# Patient Record
Sex: Male | Born: 1950 | Race: White | Hispanic: No | Marital: Single | Smoking: Never smoker
Health system: Southern US, Community
[De-identification: ages and names within clinical notes are randomized; demographics above are authoritative.]

## PROBLEM LIST (undated history)

## (undated) DIAGNOSIS — E669 Obesity, unspecified: Secondary | ICD-10-CM

## (undated) DIAGNOSIS — Z7901 Long term (current) use of anticoagulants: Secondary | ICD-10-CM

## (undated) DIAGNOSIS — I10 Essential (primary) hypertension: Secondary | ICD-10-CM

## (undated) DIAGNOSIS — E785 Hyperlipidemia, unspecified: Secondary | ICD-10-CM

## (undated) DIAGNOSIS — I739 Peripheral vascular disease, unspecified: Secondary | ICD-10-CM

## (undated) DIAGNOSIS — D696 Thrombocytopenia, unspecified: Secondary | ICD-10-CM

## (undated) DIAGNOSIS — E1140 Type 2 diabetes mellitus with diabetic neuropathy, unspecified: Secondary | ICD-10-CM

## (undated) DIAGNOSIS — E119 Type 2 diabetes mellitus without complications: Secondary | ICD-10-CM

## (undated) DIAGNOSIS — I2510 Atherosclerotic heart disease of native coronary artery without angina pectoris: Secondary | ICD-10-CM

## (undated) DIAGNOSIS — Z9114 Patient's other noncompliance with medication regimen: Secondary | ICD-10-CM

## (undated) DIAGNOSIS — F22 Delusional disorders: Secondary | ICD-10-CM

## (undated) DIAGNOSIS — Z91148 Patient's other noncompliance with medication regimen for other reason: Secondary | ICD-10-CM

## (undated) DIAGNOSIS — I4891 Unspecified atrial fibrillation: Secondary | ICD-10-CM

## (undated) HISTORY — DX: Obesity, unspecified: E66.9

## (undated) HISTORY — PX: TONSILECTOMY, ADENOIDECTOMY, BILATERAL MYRINGOTOMY AND TUBES: SHX2538

## (undated) HISTORY — DX: Peripheral vascular disease, unspecified: I73.9

## (undated) HISTORY — DX: Type 2 diabetes mellitus with diabetic neuropathy, unspecified: E11.40

## (undated) HISTORY — DX: Delusional disorders: F22

## (undated) HISTORY — DX: Unspecified atrial fibrillation: I48.91

## (undated) HISTORY — DX: Essential (primary) hypertension: I10

## (undated) HISTORY — DX: Atherosclerotic heart disease of native coronary artery without angina pectoris: I25.10

## (undated) HISTORY — PX: APPENDECTOMY: SHX54

## (undated) HISTORY — DX: Hyperlipidemia, unspecified: E78.5

---

## 1998-01-16 HISTORY — PX: LEFT HEART CATH: SHX5946

## 2004-03-01 ENCOUNTER — Encounter: Admission: RE | Admit: 2004-03-01 | Discharge: 2004-03-01 | Payer: Self-pay

## 2012-04-16 ENCOUNTER — Encounter: Payer: Self-pay | Admitting: Vascular Surgery

## 2012-04-16 ENCOUNTER — Other Ambulatory Visit: Payer: Self-pay | Admitting: *Deleted

## 2012-04-16 DIAGNOSIS — L97909 Non-pressure chronic ulcer of unspecified part of unspecified lower leg with unspecified severity: Secondary | ICD-10-CM

## 2012-04-16 DIAGNOSIS — I739 Peripheral vascular disease, unspecified: Secondary | ICD-10-CM

## 2012-05-15 ENCOUNTER — Encounter: Payer: Self-pay | Admitting: Vascular Surgery

## 2012-05-16 ENCOUNTER — Encounter: Payer: Self-pay | Admitting: Vascular Surgery

## 2012-06-20 ENCOUNTER — Other Ambulatory Visit: Payer: Self-pay | Admitting: *Deleted

## 2012-06-20 DIAGNOSIS — L97909 Non-pressure chronic ulcer of unspecified part of unspecified lower leg with unspecified severity: Secondary | ICD-10-CM

## 2012-07-16 HISTORY — PX: CARDIOVASCULAR STRESS TEST: SHX262

## 2012-07-31 ENCOUNTER — Encounter: Payer: Self-pay | Admitting: Vascular Surgery

## 2012-08-01 ENCOUNTER — Encounter: Payer: Self-pay | Admitting: Vascular Surgery

## 2012-08-06 ENCOUNTER — Encounter: Payer: Self-pay | Admitting: Vascular Surgery

## 2012-08-07 ENCOUNTER — Encounter: Payer: Self-pay | Admitting: Vascular Surgery

## 2012-08-07 ENCOUNTER — Ambulatory Visit (INDEPENDENT_AMBULATORY_CARE_PROVIDER_SITE_OTHER): Payer: Medicare Other | Admitting: Vascular Surgery

## 2012-08-07 ENCOUNTER — Encounter (INDEPENDENT_AMBULATORY_CARE_PROVIDER_SITE_OTHER): Payer: Medicare Other | Admitting: *Deleted

## 2012-08-07 VITALS — BP 123/86 | HR 94 | Temp 97.0°F | Resp 16 | Ht 70.5 in | Wt 234.0 lb

## 2012-08-07 DIAGNOSIS — L97909 Non-pressure chronic ulcer of unspecified part of unspecified lower leg with unspecified severity: Secondary | ICD-10-CM

## 2012-08-07 DIAGNOSIS — I739 Peripheral vascular disease, unspecified: Secondary | ICD-10-CM

## 2012-08-07 NOTE — Progress Notes (Signed)
Vascular and Vein Specialist of [facility]  Patient name: Samuel Mccoy MRN: [id number] DOB: 29-Aug-1950 Sex: male  REASON FOR CONSULT: Nonhealing wound of the right great toe. Third by Dr. Brent Bulla.  HPI: Samuel Mccoy is a 62 y.o. male who has a long history of ulcers on both feet. No significant ulcer at this point is a wound over the metatarsal head of the right great toe on the plantar surface of the foot. He is followed by Dr. Brent Bulla. Is not sure exactly when the ulcer began. He does state that she's had chronic problems with wounds on his feet. He denies any history of claudication or rest pain. He is unaware of any previous history of DVT or phlebitis. Does have diabetes but states his blood sugar has been relatively well controlled. I have reviewed his records from Dr. Lamar Sprinkles office. He has had a chronic wound of the lower legs and has been having aggressive outpatient care for these. Given that the right toe wound was not making significant progress he was sent for vascular consultation.  Past Medical History  Diagnosis Date  . Ulcer April 2014    Bilateral foot  . Diabetes mellitus without complication     Type II  . Diabetic neuropathy     Bilateral foot  . Peripheral vascular disease   . Coronary atherosclerosis of unspecified type of vessel, native or graft   . Hyperlipidemia   . Obese   . Atrial fibrillation   . Hypertension   . Paranoid type delusional disorder    Family History  Problem Relation Age of Onset  . Diabetes Father   . Heart disease Father     Heart Disease before age 59  . Cancer Father   . Hyperlipidemia Father   . Hypertension Father   . Heart attack Father   . Hypertension Mother   . Cancer Mother   . Deep vein thrombosis Mother    SOCIAL HISTORY: History  Substance Use Topics  . Smoking status: Never Smoker   . Smokeless tobacco: Never Used  . Alcohol Use: No   Not on File  Current Outpatient Prescriptions   Medication Sig Dispense Refill  . aspirin 81 MG tablet Take 81 mg by mouth daily.      . carvedilol (COREG) 25 MG tablet Take 25 mg by mouth 2 (two) times daily with a meal.      . glyBURIDE-metformin (GLUCOVANCE) 5-500 MG per tablet Take 1 tablet by mouth daily with breakfast.      . lisinopril (PRINIVIL,ZESTRIL) 20 MG tablet Take 20 mg by mouth daily.      . pravastatin (PRAVACHOL) 40 MG tablet Take 40 mg by mouth daily.      . Rivaroxaban (XARELTO) 20 MG TABS Take by mouth daily.      . cloNIDine (CATAPRES) 0.2 MG tablet Take 0.2 mg by mouth 2 (two) times daily.       No current facility-administered medications for this visit.   REVIEW OF SYSTEMS: [name] ] denotes positive finding; [  ] denotes negative finding  CARDIOVASCULAR:  [ ]  chest pain   [ ]  chest pressure   [ ]  palpitations   [ ]  orthopnea   [ ]  dyspnea on exertion   [ ]  claudication   [ ]  rest pain   [ ]  DVT   [ ]  phlebitis PULMONARY:   [ ]  productive cough   [ ]  asthma   [ ]  wheezing NEUROLOGIC:   [ ]   weakness  [ ]  paresthesias  [ ]  aphasia  [ ]  amaurosis  [name] ] dizziness HEMATOLOGIC:   [ ]  bleeding problems   [ ]  clotting disorders MUSCULOSKELETAL:  [ ]  joint pain   [ ]  joint swelling [ ]  leg swelling GASTROINTESTINAL: [ ]   blood in stool  [ ]   hematemesis GENITOURINARY:  [ ]   dysuria  [ ]   hematuria PSYCHIATRIC:  [ ]  history of major depression INTEGUMENTARY:  [ ]  rashes  [name] ] ulcers CONSTITUTIONAL:  [ ]  fever   [ ]  chills  PHYSICAL EXAM: Filed Vitals:   08/07/12 1439  BP: 123/86  Pulse: 94  Temp: 97 F (36.1 C)  TempSrc: Oral  Resp: 16  Height: 5' 10.5" (1.791 m)  Weight: 234 lb (106.142 kg)  SpO2: 97%   Body mass index is 33.09 kg/(m^2). GENERAL: The patient is a well-nourished male, in no acute distress. The vital signs are documented above. CARDIOVASCULAR: There is a regular rate and rhythm. Do not detect carotid bruits. He has palpable femoral, popliteal, dorsalis pedis, and posterior tibial pulses  bilaterally. He has no significant lower extremity swelling. PULMONARY: There is good air exchange bilaterally without wheezing or rales. ABDOMEN: Soft and non-tender with normal pitched bowel sounds.  MUSCULOSKELETAL: There are no major deformities or cyanosis. NEUROLOGIC: No focal weakness or paresthesias are detected. SKIN: There are no ulcers or rashes noted. He has hyperpigmentation bilaterally consistent with chronic venous insufficiency. I did probe the wound on the lateral aspect of his right metatarsal head and did not note any exposed bone. PSYCHIATRIC: The patient has a normal affect.  DATA:  I have independently interpreted his arterial Doppler study which shows triphasic Doppler signals in the dorsalis pedis and posterior tibial positions bilaterally with ABIs of 100% bilaterally.  MEDICAL ISSUES: Based on his exam and arterial Doppler study he has no evidence of significant peripheral vascular disease and should have adequate circulation to heal the wound on his toe. It looks like he does need some debridement of the callus and he will follow up with his primary care physician on this. If this wound failed to heal or progressed and he would require primary amputation of the toe. With respect to his venous insufficiency, he does not have any venous ulcers currently. However we have discussed the importance of intermittent leg elevation. I be happy to see him back at any time if any new vascular issues arise.  Alyssamarie Mounsey S [facility] [place] [facility]: [phone]

## 2013-07-01 ENCOUNTER — Ambulatory Visit (HOSPITAL_COMMUNITY): Admit: 2013-07-01 | Payer: Self-pay | Admitting: Cardiology

## 2013-07-01 ENCOUNTER — Observation Stay (HOSPITAL_COMMUNITY)
Admission: EM | Admit: 2013-07-01 | Discharge: 2013-07-03 | Disposition: A | Discharge status: Home / Self Care | Payer: Medicare Other | Attending: Emergency Medicine | Admitting: Emergency Medicine

## 2013-07-01 ENCOUNTER — Encounter (HOSPITAL_COMMUNITY)
Admission: EM | Disposition: A | Discharge status: Home / Self Care | Payer: Self-pay | Source: Home / Self Care | Attending: Emergency Medicine

## 2013-07-01 ENCOUNTER — Encounter (HOSPITAL_COMMUNITY): Payer: Self-pay | Admitting: Emergency Medicine

## 2013-07-01 ENCOUNTER — Emergency Department (HOSPITAL_COMMUNITY): Payer: Medicare Other

## 2013-07-01 DIAGNOSIS — E669 Obesity, unspecified: Secondary | ICD-10-CM | POA: Insufficient documentation

## 2013-07-01 DIAGNOSIS — Z872 Personal history of diseases of the skin and subcutaneous tissue: Secondary | ICD-10-CM | POA: Insufficient documentation

## 2013-07-01 DIAGNOSIS — Z7982 Long term (current) use of aspirin: Secondary | ICD-10-CM | POA: Insufficient documentation

## 2013-07-01 DIAGNOSIS — Z79899 Other long term (current) drug therapy: Secondary | ICD-10-CM | POA: Insufficient documentation

## 2013-07-01 DIAGNOSIS — D696 Thrombocytopenia, unspecified: Secondary | ICD-10-CM | POA: Diagnosis present

## 2013-07-01 DIAGNOSIS — I10 Essential (primary) hypertension: Secondary | ICD-10-CM | POA: Insufficient documentation

## 2013-07-01 DIAGNOSIS — Z8659 Personal history of other mental and behavioral disorders: Secondary | ICD-10-CM | POA: Insufficient documentation

## 2013-07-01 DIAGNOSIS — Z91148 Patient's other noncompliance with medication regimen for other reason: Secondary | ICD-10-CM

## 2013-07-01 DIAGNOSIS — Z7901 Long term (current) use of anticoagulants: Secondary | ICD-10-CM | POA: Insufficient documentation

## 2013-07-01 DIAGNOSIS — L97909 Non-pressure chronic ulcer of unspecified part of unspecified lower leg with unspecified severity: Secondary | ICD-10-CM | POA: Diagnosis present

## 2013-07-01 DIAGNOSIS — E785 Hyperlipidemia, unspecified: Secondary | ICD-10-CM

## 2013-07-01 DIAGNOSIS — I2510 Atherosclerotic heart disease of native coronary artery without angina pectoris: Secondary | ICD-10-CM | POA: Insufficient documentation

## 2013-07-01 DIAGNOSIS — E119 Type 2 diabetes mellitus without complications: Secondary | ICD-10-CM | POA: Diagnosis present

## 2013-07-01 DIAGNOSIS — I483 Typical atrial flutter: Secondary | ICD-10-CM

## 2013-07-01 DIAGNOSIS — Z9889 Other specified postprocedural states: Secondary | ICD-10-CM | POA: Insufficient documentation

## 2013-07-01 DIAGNOSIS — I739 Peripheral vascular disease, unspecified: Secondary | ICD-10-CM

## 2013-07-01 DIAGNOSIS — E1149 Type 2 diabetes mellitus with other diabetic neurological complication: Secondary | ICD-10-CM | POA: Insufficient documentation

## 2013-07-01 DIAGNOSIS — I4892 Unspecified atrial flutter: Principal | ICD-10-CM | POA: Diagnosis present

## 2013-07-01 DIAGNOSIS — E1142 Type 2 diabetes mellitus with diabetic polyneuropathy: Secondary | ICD-10-CM | POA: Insufficient documentation

## 2013-07-01 DIAGNOSIS — Z9114 Patient's other noncompliance with medication regimen: Secondary | ICD-10-CM

## 2013-07-01 HISTORY — DX: Patient's other noncompliance with medication regimen: Z91.14

## 2013-07-01 HISTORY — DX: Type 2 diabetes mellitus without complications: E11.9

## 2013-07-01 HISTORY — DX: Long term (current) use of anticoagulants: Z79.01

## 2013-07-01 HISTORY — DX: Thrombocytopenia, unspecified: D69.6

## 2013-07-01 HISTORY — DX: Patient's other noncompliance with medication regimen for other reason: Z91.148

## 2013-07-01 LAB — CBC
HCT: 45.6 % (ref 39.0–52.0)
HEMOGLOBIN: 15.9 g/dL (ref 13.0–17.0)
MCH: 31.3 pg (ref 26.0–34.0)
MCHC: 34.9 g/dL (ref 30.0–36.0)
MCV: 89.8 fL (ref 78.0–100.0)
Platelets: 138 10*3/uL — ABNORMAL LOW (ref 150–400)
RBC: 5.08 MIL/uL (ref 4.22–5.81)
RDW: 13 % (ref 11.5–15.5)
WBC: 6.8 10*3/uL (ref 4.0–10.5)

## 2013-07-01 LAB — BASIC METABOLIC PANEL
BUN: 18 mg/dL (ref 6–23)
CHLORIDE: 98 meq/L (ref 96–112)
CO2: 27 meq/L (ref 19–32)
Calcium: 9.9 mg/dL (ref 8.4–10.5)
Creatinine, Ser: 0.84 mg/dL (ref 0.50–1.35)
GFR calc Af Amer: >90 mL/min (ref >90)
Glucose, Bld: 207 mg/dL — ABNORMAL HIGH (ref 70–99)
POTASSIUM: 4.5 meq/L (ref 3.7–5.3)
SODIUM: 140 meq/L (ref 137–147)

## 2013-07-01 LAB — GLUCOSE, CAPILLARY
GLUCOSE-CAPILLARY: 154 mg/dL — AB (ref 70–99)
Glucose-Capillary: 224 mg/dL — ABNORMAL HIGH (ref 70–99)

## 2013-07-01 LAB — PRO B NATRIURETIC PEPTIDE: PRO B NATRI PEPTIDE: 241.1 pg/mL — AB (ref 0–125)

## 2013-07-01 LAB — TROPONIN I
Troponin I: <0.3 ng/mL (ref <0.30)
Troponin I: <0.3 ng/mL (ref <0.30)

## 2013-07-01 LAB — TSH: TSH: 1.66 u[IU]/mL (ref 0.350–4.500)

## 2013-07-01 LAB — MAGNESIUM: Magnesium: 1.1 mg/dL — ABNORMAL LOW (ref 1.5–2.5)

## 2013-07-01 SURGERY — LEFT HEART CATHETERIZATION WITH CORONARY ANGIOGRAM
Anesthesia: LOCAL

## 2013-07-01 MED ORDER — CLONIDINE HCL 0.3 MG PO TABS
0.3000 mg | ORAL_TABLET | Freq: Every day | ORAL | Status: DC
  Administered 2013-07-01 – 2013-07-03 (×3): 0.3 mg via ORAL
  Filled 2013-07-01 (×3): qty 1

## 2013-07-01 MED ORDER — RIVAROXABAN 20 MG PO TABS
20.0000 mg | ORAL_TABLET | Freq: Every day | ORAL | Status: DC
  Administered 2013-07-02: 20 mg via ORAL
  Filled 2013-07-01 (×3): qty 1

## 2013-07-01 MED ORDER — SIMVASTATIN 20 MG PO TABS
20.0000 mg | ORAL_TABLET | Freq: Every day | ORAL | Status: DC
  Filled 2013-07-01: qty 1

## 2013-07-01 MED ORDER — INSULIN ASPART 100 UNIT/ML ~~LOC~~ SOLN
0.0000 [IU] | Freq: Every day | SUBCUTANEOUS | Status: DC
  Administered 2013-07-01: 2 [IU] via SUBCUTANEOUS
  Administered 2013-07-02: 3 [IU] via SUBCUTANEOUS

## 2013-07-01 MED ORDER — LISINOPRIL 20 MG PO TABS
20.0000 mg | ORAL_TABLET | Freq: Every day | ORAL | Status: DC
Start: 2013-07-01 — End: 2013-07-03
  Administered 2013-07-01 – 2013-07-03 (×3): 20 mg via ORAL
  Filled 2013-07-01 (×3): qty 1

## 2013-07-01 MED ORDER — REGADENOSON 0.4 MG/5ML IV SOLN
0.4000 mg | Freq: Once | INTRAVENOUS | Status: AC
  Administered 2013-07-02: 0.4 mg via INTRAVENOUS
  Filled 2013-07-01: qty 5

## 2013-07-01 MED ORDER — DILTIAZEM HCL 100 MG IV SOLR
5.0000 mg/h | Freq: Once | INTRAVENOUS | Status: AC
  Administered 2013-07-01: 5 mg/h via INTRAVENOUS

## 2013-07-01 MED ORDER — DILTIAZEM HCL 100 MG IV SOLR
5.0000 mg/h | INTRAVENOUS | Status: DC
  Filled 2013-07-01: qty 100

## 2013-07-01 MED ORDER — ASPIRIN 81 MG PO CHEW
81.0000 mg | CHEWABLE_TABLET | Freq: Every day | ORAL | Status: DC
  Administered 2013-07-02 – 2013-07-03 (×2): 81 mg via ORAL
  Filled 2013-07-01 (×2): qty 1

## 2013-07-01 MED ORDER — INSULIN ASPART 100 UNIT/ML ~~LOC~~ SOLN
0.0000 [IU] | Freq: Three times a day (TID) | SUBCUTANEOUS | Status: DC
  Administered 2013-07-02: 5 [IU] via SUBCUTANEOUS
  Administered 2013-07-02 – 2013-07-03 (×2): 3 [IU] via SUBCUTANEOUS

## 2013-07-01 MED ORDER — CARVEDILOL 25 MG PO TABS
25.0000 mg | ORAL_TABLET | Freq: Two times a day (BID) | ORAL | Status: DC
Start: 2013-07-01 — End: 2013-07-03
  Administered 2013-07-01 – 2013-07-03 (×3): 25 mg via ORAL
  Filled 2013-07-01 (×6): qty 1

## 2013-07-01 NOTE — Progress Notes (Signed)
Received call from CCMD that patient had a 7 beat run of vtach.  MD notified.  No new orders received.  Will continue to monitor patient.

## 2013-07-01 NOTE — ED Notes (Signed)
Pt came in via [facility]; pt placed on monitor, continuous pulse oximetry and blood pressure cuff; vitals and EKG performed

## 2013-07-01 NOTE — ED Notes (Addendum)
To ED via [facility] from dr's office--[address] [employer] in [place]. Has been noncompliant with medicine, on arrival -- alert/oriented x 3, color- pale on arrival, pink at present. Denies any chest pain.

## 2013-07-01 NOTE — ED Notes (Signed)
Report called to 3W.

## 2013-07-01 NOTE — H&P (Signed)
Patient ID: Samuel Mccoy MRN: [id number], DOB/AGE: 1950/05/03   Admit date: 07/01/2013   Primary Physician: Lillard Anes, MD Primary Cardiologist: New Consult reason: atrial flutter   Pt. Profile:  Samuel Mccoy is a 63 y.o. male with a history of paranoid type delusion disorder, HTN, HLD, DMT2, obesity, chronic bilateral foot ulcers, and PAF on Xarelto who presented to [facility] from office via EMS for atrial flutter.   HPI He has never seen a cardiologist and his atrial fibrillation has been managed by his PCP. He has no history of coronary artery disease or peripheral vascular disease. He was seen in the office by Dr. Scot Dock in July of last year for non-healing wounds on his feet bilaterally. ABIs returned normal and he was advised to elevate his legs.  Today, he was at his regularly scheduled PCP follow up and reports that he was not having any problems recently. Per EMS, they were called to outpatient clinic due to EKG changes. The patient was tachycardic on arrival, with a rate in the 150 range with some concern for ST elevations. A code STEMI was called and he was brought to [facility]. Upon arrival he was noted to be in atrial flutter, with 2:1, 3:1 block with a rapid rate and the code STEMI was cancelled.  The patient is quite scattered and has a subtle psychiatric disturbance; however, he is very pleasant. He is hard to follow and does not answer questions directly. When asked a simple question he will go off about how his family's past open heart surgeries cause him bone pain and how the animals on the farm make his chest flutter. He does notice from time to time getting chest fluttering but he denies chest pain. He first told me that he does get SOB but then later denied this. He is not very active due to his obesity, but denies exertional chest pain or shortness of breath. 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 He  denies orthopnea, PND, lower extremity swelling. He thinks the wound on his right foot is because he was "with the wrong people at church." He does have a family history of coronary artery disease; his father had multiple stents and what sounds like CABG. He does not know if he's had a stress test in the past or an echo.    Problem List  Past Medical History  Diagnosis Date  . Ulcer April 2014    Bilateral foot  . Diabetes mellitus, type II   . Diabetic neuropathy     Bilateral foot  . Peripheral vascular disease   . Coronary atherosclerosis of unspecified type of vessel, native or graft   . Hyperlipidemia   . Obese   . Atrial fibrillation   . Hypertension   . Paranoid type delusional disorder     Past Surgical History  Procedure Laterality Date  . Tonsilectomy, adenoidectomy, bilateral myringotomy and tubes    . Appendectomy    . Left heart cath  2000  . Cardiovascular stress test  July 2014     Allergies  Not on File   Home Medications  Prior to Admission medications   Medication Sig Start Date End Date Taking? Authorizing Provider  aspirin 81 MG tablet Take 81 mg by mouth daily.    Historical Provider, MD  carvedilol (COREG) 25 MG tablet Take 25 mg by mouth 2 (two) times daily with a meal.  Historical Provider, MD  cloNIDine (CATAPRES) 0.2 MG tablet Take 0.2 mg by mouth 2 (two) times daily.    Historical Provider, MD  glyBURIDE-metformin (GLUCOVANCE) 5-500 MG per tablet Take 1 tablet by mouth daily with breakfast.    Historical Provider, MD  lisinopril (PRINIVIL,ZESTRIL) 20 MG tablet Take 20 mg by mouth daily.    Historical Provider, MD  pravastatin (PRAVACHOL) 40 MG tablet Take 40 mg by mouth daily.    Historical Provider, MD  Rivaroxaban (XARELTO) 20 MG TABS Take by mouth daily.    Historical Provider, MD    Family History  Family History  Problem Relation Age of Onset  . Diabetes Father   . Heart disease Father     Heart Disease before age 32  . Cancer  Father   . Hyperlipidemia Father   . Hypertension Father   . Heart attack Father   . Hypertension Mother   . Cancer Mother   . Deep vein thrombosis Mother     Social History  History   Social History  . Marital Status: Single    Spouse Name: N/A    Number of Children: N/A  . Years of Education: N/A   Occupational History  . Not on file.   Social History Main Topics  . Smoking status: Never Smoker   . Smokeless tobacco: Never Used  . Alcohol Use: No  . Drug Use: No  . Sexual Activity: Not on file   Other Topics Concern  . Not on file   Social History Narrative  . No narrative on file     Review of Systems General:  No chills, fever, night sweats or weight changes.  Cardiovascular:  No chest pain, dyspnea on exertion, edema, orthopnea, palpitations, paroxysmal nocturnal dyspnea. Dermatological: No rash, lesions/masses Respiratory: No cough, dyspnea Urologic: No hematuria, dysuria Abdominal:   No nausea, vomiting, diarrhea, bright red blood per rectum, melena, or hematemesis Neurologic:  No visual changes, wkns, changes in mental status. All other systems reviewed and are otherwise negative except as noted above.  Physical Exam  Blood pressure 105/72, temperature 98 F (36.7 C), temperature source Oral, resp. rate 20, SpO2 98.00%.  General: Pleasant, NAD. Obese. Psych: Normal affect. Neuro: Alert and oriented X 3. Moves all extremities spontaneously. HEENT: Normal  Neck: Supple without bruits or JVD. Lungs:  Resp regular and unlabored, CTA. Heart: RRR no s3, s4, or murmurs. Tachycardic Abdomen: Soft, non-tender, non-distended, BS + x 4. Distended Extremities: No clubbing, cyanosis or edema. DP/PT/Radials 2+ and equal bilaterally. Right deep ulcer on R foot with serosanguinous fluid  Labs   Lab Results  Component Value Date   WBC 6.8 07/01/2013   HGB 15.9 07/01/2013   HCT 45.6 07/01/2013   MCV 89.8 07/01/2013   PLT 138* 07/01/2013     Recent Labs Lab  07/01/13 1055  NA 140  K 4.5  CL 98  CO2 27  BUN 18  CREATININE 0.84  CALCIUM 9.9  GLUCOSE 207*     Radiology/Studies  Dg Chest Port 1 View  07/01/2013   CLINICAL DATA:  Chest pain  EXAM: PORTABLE CHEST - 1 VIEW  COMPARISON:  06/25/2012  FINDINGS: Cardiac shadow is mildly prominent. There is elevation the right hemidiaphragm again noted. Mild vascular congestion is seen without pulmonary edema. No focal infiltrate is noted.  IMPRESSION: Mild central vascular congestion.    ECG  Atrial flutter.  ASSESSMENT AND PLAN  Samuel Mccoy is a 63 y.o. male with a history of  paranoid type delusion disorder, HTN, HLD, DMT2, obesity, chronic bilateral foot ulcers, and PAF on Xarelto who presented to [facility] from office via EMS for atrial flutter.   Atrial flutter- His rate is much improved after a diltiazem bolus and infusion. Now HR in 90s. -- Continue Xarelto -- Consider 2 D ECHO to assess LV function -- Initially thought there were ischemic changes on EKG and code STEMI called. He denies chest pain and troponin negative. Will continue to cycle enzymes and if he ruled out we will plan for lexiscan myoview in the AM.    DM- SSI. Continue statin and ACE -- Will order Hga1c  HLD- continue statin   Foot ulcer- will get a wound culture.   Tyrell Antonio, PA-C 07/01/2013, 12:25 PM  Pager [phone]  Patient seen and examined. Agree with assessment and plan. Pt is a 63 yo WM with h/o AF on xarelto who was transported to [facility] with ? Code STEMI. ECG revealed A Flutter and not a STEMI. He received a cardizem 10 mg bolus by EMS and AFlutter improved from 2: 1 to 4: 1 block. No chest pain. Rate now controlled at ~ 75-80. Will cycle enzymes, check 2 d echo. He does have chronic foot ulcers with serosanguinous fluid ? Purulence. Will also check wound cultures. With cardiac risk factors including HTN, DM, HLD will also plan ischemic evaluation with nuclear imaging.    Troy Sine, MD,  [facility] 07/01/2013 1:04 PM

## 2013-07-01 NOTE — ED Provider Notes (Signed)
CSN: [id number]     Arrival date & time 07/01/13  1059 History   First MD Initiated Contact with Patient 07/01/13 1102     Chief Complaint  Patient presents with  . Chest Pain  . Tachycardia     (Consider location/radiation/quality/duration/timing/severity/associated sxs/prior Treatment) HPI     Patient presents as a code STEMI. Patient states that he feels generally unwell.  Per EMS, they were called to outpatient clinic 2 to change in EKG, and concern for patient's general discomfort. Per EMS the patient was tachycardic on arrival, with a rate in the 150 range. With some concern for ST elevations, patient was medically STEMI. En route patient received Cardizem, with a subsequent drop in heart rate into the 120 range. On my initial exam the patient denies pain, recent cough, congestion, fever, chills. Does describe generalized discomfort, though no focal area of pain. Patient endorses a history of prior dysrhythmia, CTs compliant with all medications, including Xarelto.  My initial evaluation was conducted with our cardiology team.  We canceled the code STEMI. Quick review of the patient's EKG shows atrial flutter, with 2:1, 3:1 block, rapid rate.    Past Medical History  Diagnosis Date  . Ulcer April 2014    Bilateral foot  . Diabetes mellitus without complication     Type II  . Diabetic neuropathy     Bilateral foot  . Peripheral vascular disease   . Coronary atherosclerosis of unspecified type of vessel, native or graft   . Hyperlipidemia   . Obese   . Atrial fibrillation   . Hypertension   . Paranoid type delusional disorder    Past Surgical History  Procedure Laterality Date  . Tonsilectomy, adenoidectomy, bilateral myringotomy and tubes    . Appendectomy    . Left heart cath  2000  . Cardiovascular stress test  July 2014   Family History  Problem Relation Age of Onset  . Diabetes Father   . Heart disease Father     Heart Disease before age 40  . Cancer  Father   . Hyperlipidemia Father   . Hypertension Father   . Heart attack Father   . Hypertension Mother   . Cancer Mother   . Deep vein thrombosis Mother    History  Substance Use Topics  . Smoking status: Never Smoker   . Smokeless tobacco: Never Used  . Alcohol Use: No    Review of Systems  Constitutional:       Per HPI, otherwise negative  HENT:       Per HPI, otherwise negative  Respiratory:       Per HPI, otherwise negative  Cardiovascular:       Per HPI, otherwise negative  Gastrointestinal: Negative for vomiting.  Endocrine:       Negative aside from HPI  Genitourinary:       Neg aside from HPI   Musculoskeletal:       Per HPI, otherwise negative  Skin: Negative.   Neurological: Negative for syncope.      Allergies  Review of patient's allergies indicates not on file.  Home Medications   Prior to Admission medications   Medication Sig Start Date End Date Taking? Authorizing Provider  aspirin 81 MG tablet Take 81 mg by mouth daily.    Historical Provider, MD  carvedilol (COREG) 25 MG tablet Take 25 mg by mouth 2 (two) times daily with a meal.    Historical Provider, MD  cloNIDine (CATAPRES) 0.2 MG tablet Take  0.2 mg by mouth 2 (two) times daily.    Historical Provider, MD  glyBURIDE-metformin (GLUCOVANCE) 5-500 MG per tablet Take 1 tablet by mouth daily with breakfast.    Historical Provider, MD  lisinopril (PRINIVIL,ZESTRIL) 20 MG tablet Take 20 mg by mouth daily.    Historical Provider, MD  pravastatin (PRAVACHOL) 40 MG tablet Take 40 mg by mouth daily.    Historical Provider, MD  Rivaroxaban (XARELTO) 20 MG TABS Take by mouth daily.    Historical Provider, MD   BP 105/72  Temp(Src) 98 F (36.7 C) (Oral)  Resp 20  SpO2 98% Physical Exam  Nursing note and vitals reviewed. Constitutional: He is oriented to person, place, and time. He appears well-developed. No distress.  HENT:  Head: Normocephalic and atraumatic.  Eyes: Conjunctivae and EOM are  normal.  Cardiovascular: An irregularly irregular rhythm present. Tachycardia present.   Pulmonary/Chest: Effort normal. No stridor. No respiratory distress.  Abdominal: He exhibits no distension.  Musculoskeletal: He exhibits no edema.  Edema in the distal right lower extremity is greater than the left. The patient states that this is typical.   Neurological: He is alert and oriented to person, place, and time.  Skin: Skin is warm and dry.  Psychiatric: He has a normal mood and affect.    ED Course  Procedures (including critical care time) Labs Review Labs Reviewed  MAGNESIUM - Abnormal; Notable for the following:    Magnesium 1.1 (*)    All other components within normal limits  PRO B NATRIURETIC PEPTIDE - Abnormal; Notable for the following:    Pro B Natriuretic peptide (BNP) 241.1 (*)    All other components within normal limits  CBC - Abnormal; Notable for the following:    Platelets 138 (*)    All other components within normal limits  BASIC METABOLIC PANEL - Abnormal; Notable for the following:    Glucose, Bld 207 (*)    All other components within normal limits  TROPONIN I  TSH   Reviewed the patient's chart.  Imaging Review Dg Chest Port 1 View  07/01/2013   CLINICAL DATA:  Chest pain  EXAM: PORTABLE CHEST - 1 VIEW  COMPARISON:  06/25/2012  FINDINGS: Cardiac shadow is mildly prominent. There is elevation the right hemidiaphragm again noted. Mild vascular congestion is seen without pulmonary edema. No focal infiltrate is noted.  IMPRESSION: Mild central vascular congestion.   Electronically Signed   By: Inez Catalina M.D.   On: 07/01/2013 11:44     EKG Interpretation   Date/Time:  Tuesday July 01 2013 11:01:53 EDT Ventricular Rate:  81 PR Interval:    QRS Duration: 84 QT Interval:  382 QTC Calculation: 443 R Axis:   -7 Text Interpretation:  Atrial flutter Repol abnrm suggests ischemia,  anterolateral Atrial flutter Artifact Abnormal ekg Confirmed by Carmin Muskrat  MD ([id number]) on 07/01/2013 11:42:35 AM     After arrival, with heart rate in the 120/150 range, evidence of atrial flutter, patient received Cardizem drip.  12:25 PM On re-eval the HR is 80's - though still w aflutter    MDM   Patient presents with generalized discomfort. Patient was initially pale, but awake and alert. Patient's initial evaluation is notable for atrial flutter with rapid heart rate. Patient does have history of atrial fibrillation, and is currently taking anticoagulants. However, with the persistent tachycardia the patient was started on diltiazem drip.  When asked chest pain, and with morphology similar to prior EKG, there is  low suspicion for new coronary ischemia, but given the profound dysrhythmia, patient required admission for further evaluation and management.  CRITICAL CARE Performed by: Carmin Muskrat Total critical care time: 30 Critical care time was exclusive of separately billable procedures and treating other patients. Critical care was necessary to treat or prevent imminent or life-threatening deterioration. Critical care was time spent personally by me on the following activities: development of treatment plan with patient and/or surrogate as well as nursing, discussions with consultants, evaluation of patient's response to treatment, examination of patient, obtaining history from patient or surrogate, ordering and performing treatments and interventions, ordering and review of laboratory studies, ordering and review of radiographic studies, pulse oximetry and re-evaluation of patient's condition.    Carmin Muskrat, MD 07/01/13 1451

## 2013-07-02 ENCOUNTER — Observation Stay (HOSPITAL_COMMUNITY): Payer: Medicare Other

## 2013-07-02 DIAGNOSIS — E119 Type 2 diabetes mellitus without complications: Secondary | ICD-10-CM

## 2013-07-02 DIAGNOSIS — L97909 Non-pressure chronic ulcer of unspecified part of unspecified lower leg with unspecified severity: Secondary | ICD-10-CM

## 2013-07-02 DIAGNOSIS — R079 Chest pain, unspecified: Secondary | ICD-10-CM

## 2013-07-02 DIAGNOSIS — I369 Nonrheumatic tricuspid valve disorder, unspecified: Secondary | ICD-10-CM

## 2013-07-02 DIAGNOSIS — I4892 Unspecified atrial flutter: Principal | ICD-10-CM

## 2013-07-02 DIAGNOSIS — I739 Peripheral vascular disease, unspecified: Secondary | ICD-10-CM

## 2013-07-02 LAB — CBC
HCT: 44.9 % (ref 39.0–52.0)
Hemoglobin: 15.4 g/dL (ref 13.0–17.0)
MCH: 31 pg (ref 26.0–34.0)
MCHC: 34.3 g/dL (ref 30.0–36.0)
MCV: 90.5 fL (ref 78.0–100.0)
Platelets: 133 10*3/uL — ABNORMAL LOW (ref 150–400)
RBC: 4.96 MIL/uL (ref 4.22–5.81)
RDW: 13.1 % (ref 11.5–15.5)
WBC: 10.5 10*3/uL (ref 4.0–10.5)

## 2013-07-02 LAB — BASIC METABOLIC PANEL
BUN: 26 mg/dL — ABNORMAL HIGH (ref 6–23)
CO2: 28 mEq/L (ref 19–32)
Calcium: 9.7 mg/dL (ref 8.4–10.5)
Chloride: 94 mEq/L — ABNORMAL LOW (ref 96–112)
Creatinine, Ser: 1.22 mg/dL (ref 0.50–1.35)
GFR, EST AFRICAN AMERICAN: 72 mL/min — AB (ref >90)
GFR, EST NON AFRICAN AMERICAN: 62 mL/min — AB (ref >90)
Glucose, Bld: 150 mg/dL — ABNORMAL HIGH (ref 70–99)
POTASSIUM: 4.5 meq/L (ref 3.7–5.3)
SODIUM: 139 meq/L (ref 137–147)

## 2013-07-02 LAB — LIPID PANEL
CHOLESTEROL: 146 mg/dL (ref 0–200)
HDL: 36 mg/dL — ABNORMAL LOW (ref >39)
LDL Cholesterol: 49 mg/dL (ref 0–99)
Total CHOL/HDL Ratio: 4.1 RATIO
Triglycerides: 305 mg/dL — ABNORMAL HIGH (ref <150)
VLDL: 61 mg/dL — ABNORMAL HIGH (ref 0–40)

## 2013-07-02 LAB — TROPONIN I: Troponin I: <0.3 ng/mL (ref <0.30)

## 2013-07-02 LAB — GLUCOSE, CAPILLARY
GLUCOSE-CAPILLARY: 244 mg/dL — AB (ref 70–99)
GLUCOSE-CAPILLARY: 191 mg/dL — AB (ref 70–99)
Glucose-Capillary: 264 mg/dL — ABNORMAL HIGH (ref 70–99)

## 2013-07-02 MED ORDER — REGADENOSON 0.4 MG/5ML IV SOLN
INTRAVENOUS | Status: AC
  Administered 2013-07-02: 0.4 mg via INTRAVENOUS
  Filled 2013-07-02: qty 5

## 2013-07-02 MED ORDER — TECHNETIUM TC 99M SESTAMIBI - CARDIOLITE
30.0000 | Freq: Once | INTRAVENOUS | Status: AC | PRN
  Administered 2013-07-02: 30 via INTRAVENOUS

## 2013-07-02 MED ORDER — TECHNETIUM TC 99M SESTAMIBI - CARDIOLITE
10.0000 | Freq: Once | INTRAVENOUS | Status: AC | PRN
  Administered 2013-07-02: 10:00:00 10 via INTRAVENOUS

## 2013-07-02 MED ORDER — DILTIAZEM HCL ER COATED BEADS 120 MG PO CP24
120.0000 mg | ORAL_CAPSULE | Freq: Every day | ORAL | Status: DC
  Administered 2013-07-02 – 2013-07-03 (×2): 120 mg via ORAL
  Filled 2013-07-02 (×2): qty 1

## 2013-07-02 MED ORDER — PRAVASTATIN SODIUM 40 MG PO TABS
40.0000 mg | ORAL_TABLET | Freq: Every day | ORAL | Status: DC
  Administered 2013-07-02: 40 mg via ORAL
  Filled 2013-07-02 (×2): qty 1

## 2013-07-02 NOTE — Progress Notes (Signed)
UR Completed Brenda Graves-Bigelow, RN,BSN [phone]  

## 2013-07-02 NOTE — Progress Notes (Signed)
UR Completed Ambera Fedele Graves-Bigelow, RN,BSN [phone]  

## 2013-07-02 NOTE — Progress Notes (Signed)
Patient Name: JEFF FRIEDEN Date of Encounter: 07/02/2013     Active Problems:   Atrial flutter    SUBJECTIVE  Seen in echo lab during stress test. He underwent lexiscan myoview and tolerated it well. He is still in flutter with CVR. No CP or SOB.  CURRENT MEDS . aspirin  81 mg Oral Daily  . carvedilol  25 mg Oral BID WC  . cloNIDine  0.3 mg Oral Daily  . insulin aspart  0-15 Units Subcutaneous TID WC  . insulin aspart  0-5 Units Subcutaneous QHS  . lisinopril  20 mg Oral Daily  . pravastatin  40 mg Oral q1800  . regadenoson  0.4 mg Intravenous Once  . rivaroxaban  20 mg Oral Q supper    OBJECTIVE  Filed Vitals:   07/01/13 2108 07/01/13 2154 07/02/13 0133 07/02/13 0420  BP: 88/58 90/60 122/72 133/75  Pulse:    99  Temp:    97.6 F (36.4 C)  TempSrc:    Oral  Resp:    20  Height:      Weight:    254 lb 11.2 oz (115.531 kg)  SpO2:    98%    Intake/Output Summary (Last 24 hours) at 07/02/13 0747 Last data filed at 07/01/13 1805  Gross per 24 hour  Intake      0 ml  Output      0 ml  Net      0 ml   Filed Weights   07/01/13 1604 07/02/13 0420  Weight: 253 lb 11.2 oz (115.078 kg) 254 lb 11.2 oz (115.531 kg)    PHYSICAL EXAM  General: Pleasant, NAD. Neuro: Alert and oriented X 3. Moves all extremities spontaneously. Psych: Normal affect. HEENT:  Normal  Neck: Supple without bruits or JVD. Lungs:  Resp regular and unlabored, CTA. Heart: RRR no s3, s4, or murmurs. Abdomen: Soft, non-tender, non-distended, BS + x 4.  Extremities: No clubbing, cyanosis or edema. DP/PT/Radials 2+ and equal bilaterally.  Accessory Clinical Findings  CBC  Recent Labs  07/01/13 1055 07/02/13 0415  WBC 6.8 10.5  HGB 15.9 15.4  HCT 45.6 44.9  MCV 89.8 90.5  PLT 138* 762*   Basic Metabolic Panel  Recent Labs  07/01/13 1055 07/02/13 0415  NA 140 139  K 4.5 4.5  CL 98 94*  CO2 27 28  GLUCOSE 207* 150*  BUN 18 26*  CREATININE 0.84 1.22  CALCIUM 9.9 9.7    MG 1.1*  --    Liver Function Tests No results found for this basename: AST, ALT, ALKPHOS, BILITOT, PROT, ALBUMIN,  in the last 72 hours No results found for this basename: LIPASE, AMYLASE,  in the last 72 hours Cardiac Enzymes  Recent Labs  07/01/13 1055 07/01/13 2245 07/02/13 0415  TROPONINI <0.30 <0.30 <0.30   BNP No components found with this basename: POCBNP,  D-Dimer No results found for this basename: DDIMER,  in the last 72 hours Hemoglobin A1C No results found for this basename: HGBA1C,  in the last 72 hours Fasting Lipid Panel  Recent Labs  07/02/13 0415  CHOL 146  HDL 36*  LDLCALC 49  TRIG 305*  CHOLHDL 4.1   Thyroid Function Tests  Recent Labs  07/01/13 1236  TSH 1.660    TELE  Atrial flutter HR 90s  Radiology/Studies  Dg Chest Port 1 View  07/01/2013   CLINICAL DATA:  Chest pain  EXAM: PORTABLE CHEST - 1 VIEW  COMPARISON:  06/25/2012  FINDINGS:  Cardiac shadow is mildly prominent. There is elevation the right hemidiaphragm again noted. Mild vascular congestion is seen without pulmonary edema. No focal infiltrate is noted.  IMPRESSION: Mild central vascular congestion.    ASSESSMENT AND PLAN  SHEFFIELD HAWKER is a 63 y.o. male with a history of paranoid type delusion disorder, HTN, HLD, DMT2, obesity, chronic bilateral foot ulcers, and PAF on Xarelto who presented to [facility] on 07/01/13 from office via EMS for atrial flutter. Code STEMI called initially. ECG revealed A Flutter and not a STEMI. He received a cardizem 10 mg bolus by EMS and AFlutter improved from 2: 1 to 4: 1 block  Atrial flutter- His rate is much improved after a diltiazem bolus and infusion. Now HR in 90s.  -- Continue Xarelto. Unsure on when last dose was so resumed today.  -- 2 D ECHO to assess LV function - today -- Underwent lexiscan myoview this AM. Awaiting images.   DM- SSI. Continue statin and ACE   HLD- continue statin   Foot ulcer- wound culture pending, wound care  consult.  HTN- continue clonidine 0.3mg , lisinopril and carvedilol   Tyrell Antonio PA-C  Pager [phone]

## 2013-07-02 NOTE — Progress Notes (Signed)
Patient returned to room from stress test. He denied any pain, and appears in no distress. Blood sugar checked. Sandwich tray given. Diet reorder per PA. Safety precautions reviewed with patient. Call light and possessions within reach. Will continue to monitor.   Ave Filter, RN

## 2013-07-02 NOTE — Progress Notes (Signed)
Patient left unit for a stress test this AM.   Ave Filter, RN

## 2013-07-02 NOTE — Progress Notes (Signed)
Patient ID: Samuel Mccoy, male   DOB: 21-Aug-1950, 63 y.o.   MRN: [id number] Discussed case with Dr Radford Pax Patient is not having any chest pain Came to hospital for rapid flutter mis read as acute MI Myovue is "low risk" Not appropriate for CT with highly variable flutter Would proceed with [facility] in am if he has been taking his xarelto and or TEE/DCC if there is any evidence Of noncompliance Will cancel CT and make NPO for [facility]  Dr Radford Pax to see and arrange in am  Jenkins Rouge

## 2013-07-02 NOTE — Progress Notes (Signed)
  Echocardiogram 2D Echocardiogram has been performed.  Samuel Mccoy 07/02/2013, 3:28 PM

## 2013-07-02 NOTE — Progress Notes (Signed)
Patient seen and examined and agree with note as outlined by Perry Mount PA-C.  Await results of nuclear stress test.  Will get a wound care consult.  He had 7 beats of WCT that most likely is aflutter with aberration but cannot rule out VT.  Potassium ok.  His HR is still borderline controlled on Carvedilol and Cardizem gtt at 5.  Will change to PO Cardizem CD 120mg  daily.  Continue Xarelto.

## 2013-07-02 NOTE — Consult Note (Signed)
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  Wound type:Neuropathic ulcer Measurement:  2 cm x 2 cm x 2cm  Wound bed: 100% pink nongranulating tissue.  Metatarsal bone directly palpable.   Drainage (amount, consistency, odor) Moderate, serosanguinous, no odor.  Periwound:Intact, thin callous present circumferentially. Dressing procedure/placement/frequency: Has been soaking daily at home.  Discussed need to pack the wound to promote healing.  Stated he could not do it and his parents are elderly.  Would like [place] services.  Discussed that he would need a teachable caregiver for [facility] to teach.  Agreed to work on that.  [place] nurse feels that [facility] may be beneficial for wound care and to monitor cardiac status. If MD agrees, please order.  Cleanse ulcer right foot with NS and pat gently dry.  Apply NS moistened packing strip to wound bed.  TOp with dry 4x4 and secure with kerlix and tape. CHange daily.  Will not follow at this time.  Please re-consult if needed.  Domenic Moras RN BSN [place] Pager [phone]

## 2013-07-03 ENCOUNTER — Encounter (HOSPITAL_COMMUNITY): Payer: Self-pay | Admitting: Physician Assistant

## 2013-07-03 DIAGNOSIS — E119 Type 2 diabetes mellitus without complications: Secondary | ICD-10-CM | POA: Diagnosis present

## 2013-07-03 DIAGNOSIS — Z9114 Patient's other noncompliance with medication regimen: Secondary | ICD-10-CM

## 2013-07-03 DIAGNOSIS — Z9119 Patient's noncompliance with other medical treatment and regimen: Secondary | ICD-10-CM

## 2013-07-03 DIAGNOSIS — Z91199 Patient's noncompliance with other medical treatment and regimen due to unspecified reason: Secondary | ICD-10-CM

## 2013-07-03 DIAGNOSIS — D696 Thrombocytopenia, unspecified: Secondary | ICD-10-CM | POA: Diagnosis present

## 2013-07-03 LAB — WOUND CULTURE
Gram Stain: NONE SEEN
Special Requests: NORMAL

## 2013-07-03 LAB — GLUCOSE, CAPILLARY
GLUCOSE-CAPILLARY: 160 mg/dL — AB (ref 70–99)
Glucose-Capillary: 187 mg/dL — ABNORMAL HIGH (ref 70–99)

## 2013-07-03 MED ORDER — MAGNESIUM OXIDE 400 (241.3 MG) MG PO TABS
400.0000 mg | ORAL_TABLET | Freq: Two times a day (BID) | ORAL | Status: DC
  Filled 2013-07-03 (×2): qty 1

## 2013-07-03 MED ORDER — MAGNESIUM OXIDE 400 (241.3 MG) MG PO TABS: 400.0000 mg | ORAL_TABLET | Freq: Two times a day (BID) | ORAL | Status: AC

## 2013-07-03 MED ORDER — RIVAROXABAN 20 MG PO TABS: 20.0000 mg | ORAL_TABLET | Freq: Every day | ORAL | Status: AC

## 2013-07-03 MED ORDER — LISINOPRIL 20 MG PO TABS: 20.0000 mg | ORAL_TABLET | Freq: Every day | ORAL | Status: AC

## 2013-07-03 MED ORDER — DILTIAZEM HCL ER COATED BEADS 120 MG PO CP24: 120.0000 mg | ORAL_CAPSULE | Freq: Every day | ORAL | Status: DC

## 2013-07-03 NOTE — Discharge Summary (Signed)
CARDIOLOGY DISCHARGE SUMMARY   Patient ID: Samuel Mccoy MRN: [id number] DOB/AGE: Sep 27, 1950 63 y.o.  Admit date: 07/01/2013 Discharge date: 07/04/2013  PCP: Lillard Anes, MD Primary Cardiologist: Dr. Claiborne Billings  Primary Discharge Diagnosis:   Atrial flutter  Secondary Discharge Diagnosis:    Ulcer of lower limb, unspecified- Bilateral great toe   Hypomagnesemia   Thrombocytopenia   Diabetes mellitus, type II   Non compliance w medication regimen  Consults: Wound care  Procedures: 2-D echocardiogram, Lexi scan [facility] Course: Samuel Mccoy is a 63 y.o. male with no history of CAD. He has a history of atrial fibrillation, managed by his primary care physician. He has been on Xarelto for this. He was at his PCP for a regularly scheduled appointment in his heart rate was noted to be elevated. His ECG was abnormal. There was concern for ST elevation in the cut STEMI was called. He was transported by EMS to [facility].  His ECG was reviewed and it was felt the ST elevations were coming from the atrial flutter, and the code STEMI was canceled. He was admitted for further evaluation and treatment.  His cardiac enzymes were negative for MI. A 2-D echocardiogram was performed, results below. He had an EF of 50-55% with no wall motion abnormalities and severe concentric LVH. He was started on diltiazem to control the atrial flutter. Once his cardiac enzymes were negative, stress test was performed. Results are below. He has not been having exertional chest pain and the stress test is felt low risk. Therefore, cardiac catheterization was done indicated at this time.  The duration of the atrial flutter is unknown. It was felt he would benefit from sinus rhythm and consideration was given to a TEE/cardioversion. However, the patient refused this procedure. In addition, his mother stated that he was frequently noncompliant with his medications. He will be followed on the  diltiazem and Xarelto. If he needs cardioversion or ablation of the atrial flutter in the future, compliance with anticoagulation would need to be assured before any further procedures could be considered.  His labs were reviewed and he was noted to be hypomagnesemic, with a magnesium of 1.1. He has no apparent symptoms from this and will placed on supplementation. His blood glucose was controlled with sliding scale insulin. He was also noted to be thrombocytopenic but has no bleeding issues. No old labs are available for comparison.  He has a wound on his right foot that he has had a prolonged period of time. He has seen Dr. Scot Dock in the past but his ABIs were normal. A wound care consult was called and instructions were received regarding the care of this wound. They will be put on his discharge summary.  On 06/18, he was seen by Dr. Radford Pax and all data reviewed. No further inpatient workup is indicated and he is considered stable for discharge, to follow up as an outpatient.  Labs:  Lab Results  Component Value Date   WBC 10.5 07/02/2013   HGB 15.4 07/02/2013   HCT 44.9 07/02/2013   MCV 90.5 07/02/2013   PLT 133* 07/02/2013     Recent Labs Lab 07/02/13 0415  NA 139  K 4.5  CL 94*  CO2 28  BUN 26*  CREATININE 1.22  CALCIUM 9.7  GLUCOSE 150*    Recent Labs  07/01/13 1055 07/01/13 2245 07/02/13 0415  TROPONINI <0.30 <0.30 <0.30   Lipid Panel     Component Value Date/Time  CHOL 146 07/02/2013 0415   TRIG 305* 07/02/2013 0415   HDL 36* 07/02/2013 0415   CHOLHDL 4.1 07/02/2013 0415   VLDL 61* 07/02/2013 0415   LDLCALC 49 07/02/2013 0415    Pro B Natriuretic peptide (BNP)  Date/Time Value Ref Range Status  07/01/2013 10:55 AM 241.1* 0 - 125 pg/mL Final      Radiology: Nm Myocar Multi W/spect W/wall Motion / Ef 07/02/2013   CLINICAL DATA:  Chest pain  EXAM: MYOCARDIAL IMAGING WITH SPECT (REST AND PHARMACOLOGIC-STRESS - 2 DAY PROTOCOL)  GATED LEFT VENTRICULAR WALL MOTION STUDY   LEFT VENTRICULAR EJECTION FRACTION  TECHNIQUE: Standard myocardial SPECT imaging was performed after resting intravenous injection of 10 mCi Tc-57m sestamibi. Subsequently, on a second day, intravenous infusion of Lexiscan was performed under the supervision of the Cardiology staff. At peak effect of the drug, 30 mCi Tc-41m sestamibi was injected intravenously and standard myocardial SPECT imaging was performed. Quantitative gated imaging was also performed to evaluate left ventricular wall motion, and estimate left ventricular ejection fraction.  COMPARISON:  06/27/2012  FINDINGS: Myocardial perfusion study: Subtly reduced activity in the inferolateral wall on stress images compared to rest images near the cardiac apex, borderline for inducible ischemia quantitatively. In the more basilar region, there is reduced perfusion in this inferolateral region on both stress and rest imaging, suggesting mild scarring.  Ejection fraction calculation: End-diastolic volume is 69 mL. End systolic volume is 27 mL. Derived left ventricular ejection fraction of 57%.  Wall motion analysis: Minimal anterolateral hypokinesis.  IMPRESSION: 1. Borderline appearance for inducible ischemia along the inferolateral wall near the cardiac apex. Careful correlation with ECG results suggested in determining the need for further follow-up. There may be some mild scarring along the basilar and margin of this potential lesion.   Electronically Signed   By: Sherryl Barters M.D.   On: 07/02/2013 15:25   Dg Chest Port 1 View 07/01/2013   CLINICAL DATA:  Chest pain  EXAM: PORTABLE CHEST - 1 VIEW  COMPARISON:  06/25/2012  FINDINGS: Cardiac shadow is mildly prominent. There is elevation the right hemidiaphragm again noted. Mild vascular congestion is seen without pulmonary edema. No focal infiltrate is noted.  IMPRESSION: Mild central vascular congestion.   Electronically Signed   By: Inez Catalina M.D.   On: 07/01/2013 11:44    Echo:  07/02/2013 Study Conclusions - Left ventricle: The cavity size was normal. There was severe concentric hypertrophy. Systolic function was normal. The estimated ejection fraction was in the range of 50% to 55%. Wall motion was normal; there were no regional wall motion abnormalities. The study is not technically sufficient to allow evaluation of LV diastolic function due to atrial flutter. - Mitral valve: Severely calcified annulus predominantly posteriorly . - Left atrium: The atrium was moderately dilated. - Right atrium: The atrium was mildly dilated. - Tricuspid valve: There was mild regurgitation. - Pulmonary arteries: Systolic pressure was within the normal range. - Pericardium, extracardiac: There was no pericardial effusion. Impressions: - Normal LV size and function. Severe concentric LVH with no LVOT or intraavitary obstruction. No regional wall motion abnormalities. Moderately dilated left atrium. Normal RVSP.  EKG: 07/02/2013 Atrial flutter with variable ventricular response Rate 89   FOLLOW UP PLANS AND APPOINTMENTS No Known Allergies   Medication List         aspirin EC 81 MG tablet  Take 81 mg by mouth daily.     carvedilol 25 MG tablet  Commonly known as:  COREG  Take 25 mg by mouth 2 (two) times daily with a meal.     cloNIDine 0.3 MG tablet  Commonly known as:  CATAPRES  Take 0.3 mg by mouth daily.     diltiazem 120 MG 24 hr capsule  Commonly known as:  CARDIZEM CD  Take 1 capsule (120 mg total) by mouth daily.     glyBURIDE-metformin 5-500 MG per tablet  Commonly known as:  GLUCOVANCE  Take 1 tablet by mouth 2 (two) times daily with a meal.     hydrochlorothiazide 25 MG tablet  Commonly known as:  HYDRODIURIL  Take 25 mg by mouth daily.     ICAPS Caps  Take 1 capsule by mouth daily.     lisinopril 20 MG tablet  Commonly known as:  PRINIVIL,ZESTRIL  Take 1 tablet (20 mg total) by mouth daily.     magnesium oxide 400 (241.3 MG) MG tablet   Commonly known as:  MAG-OX  Take 1 tablet (400 mg total) by mouth 2 (two) times daily.     pravastatin 40 MG tablet  Commonly known as:  PRAVACHOL  Take 40 mg by mouth daily.     rivaroxaban 20 MG Tabs tablet  Commonly known as:  XARELTO  Take 1 tablet (20 mg total) by mouth daily with supper.     vitamin B-12 1000 MCG tablet  Commonly known as:  CYANOCOBALAMIN  Take 1,000 mcg by mouth daily.         Follow-up Information   Follow up with Troy Sine, MD On 09/25/2013. (at 1:45 pm)    Specialty:  Cardiology   Contact information:   [address] [place] [address] [phone]       BRING ALL MEDICATIONS WITH YOU TO FOLLOW UP APPOINTMENTS  Time spent with patient to include physician time: 48 min Signed: Rosaria Ferries, PA-C 07/04/2013, 8:05 AM Co-Sign MD

## 2013-07-03 NOTE — Progress Notes (Signed)
UR Completed Brenda Graves-Bigelow, RN,BSN [phone]  

## 2013-07-03 NOTE — Progress Notes (Signed)
Discharge instructions reviewed with the patient.  New/changed medications reviewed with the patient.  Follow up appointments reviewed with the patient. Patient voices understanding to teaching. Ambulatory to door.  Home via [place] with his son driving.

## 2013-07-03 NOTE — Discharge Instructions (Signed)
Cleanse ulcer right foot with Normal Saline and pat gently dry. Apply Normal Saline moistened packing strip to wound bed. Top with dry 4x4 and secure with kerlix and tape. Change daily.  Keep legs elevated when sitting.

## 2013-07-03 NOTE — Progress Notes (Signed)
Patient seen and examined and agree with note as outlined by Rosaria Ferries, PA-C.  His nuclear stress test is low risk and he has not had any chest pain.  He is very noncompliant with taking his meds so he would be at high risk for TEE/DCCV if he does not take his anticoagulation after the procedure.  Also, he refused TEE/DCCV.  Will d/c home today and plan followup as outpt with Dr. Claiborne Billings.

## 2013-07-03 NOTE — Progress Notes (Signed)
Patient Name: Samuel Mccoy Date of Encounter: 07/03/2013  Principal Problem:   Atrial flutter Active Problems:   Ulcer of lower limb, unspecified- Bilateral great toe   Hypomagnesemia   Thrombocytopenia   Diabetes mellitus, type II   Non compliance w medication regimen    Patient Profile: 63 yo male w/ hx paranoid delusion d/o, HTN, HLD, DM2, PAF, on Xarelto, bilat foot ulcers. Seen by primary MD 06/16 and sent to [facility] for aflutter RVR (initially thought code STEMI). MV 06/17 is low-risk, med rx.  SUBJECTIVE: Pt denies chest pain or SOB. Pt mother on the phone. She mentions he does not take his medication consistently, and she is unable to do anything about it. When it was mentioned that the procedure has some risk, he adamantly refused it and says he is fine. Pt denies chest pain or SOB.   OBJECTIVE Filed Vitals:   07/02/13 1651 07/02/13 2117 07/03/13 0500 07/03/13 0611  BP: 137/72 99/63  142/86  Pulse: 87 76  87  Temp: 97.6 F (36.4 C) 97.3 F (36.3 C)  97.9 F (36.6 C)  TempSrc: Oral Oral  Oral  Resp: 20 18  18   Height:      Weight:   253 lb 6.4 oz (114.941 kg)   SpO2: 100% 96%  98%    Intake/Output Summary (Last 24 hours) at 07/03/13 0829 Last data filed at 07/03/13 0630  Gross per 24 hour  Intake    720 ml  Output     50 ml  Net    670 ml   Filed Weights   07/01/13 1604 07/02/13 0420 07/03/13 0500  Weight: 253 lb 11.2 oz (115.078 kg) 254 lb 11.2 oz (115.531 kg) 253 lb 6.4 oz (114.941 kg)    PHYSICAL EXAM General: Well developed, well nourished, male in no acute distress. Head: Normocephalic, atraumatic.  Neck: Supple without bruits, JVD not elevated. Lungs:  Resp regular and unlabored, CTA. Heart: Irregular, S1, S2, no S3, S4, or murmur; no rub. Abdomen: Soft, non-tender, non-distended, BS + x 4.  Extremities: No clubbing, cyanosis, no edema. Chronic changes to skin Neuro: Alert and oriented X 3. Moves all extremities spontaneously. Psych:  Normal affect.  LABS: CBC:  Recent Labs  07/01/13 1055 07/02/13 0415  WBC 6.8 10.5  HGB 15.9 15.4  HCT 45.6 44.9  MCV 89.8 90.5  PLT 138* 536*   Basic Metabolic Panel:  Recent Labs  07/01/13 1055 07/02/13 0415  NA 140 139  K 4.5 4.5  CL 98 94*  CO2 27 28  GLUCOSE 207* 150*  BUN 18 26*  CREATININE 0.84 1.22  CALCIUM 9.9 9.7  MG 1.1*  --    Cardiac Enzymes:  Recent Labs  07/01/13 1055 07/01/13 2245 07/02/13 0415  TROPONINI <0.30 <0.30 <0.30   BNP: Pro B Natriuretic peptide (BNP)  Date/Time Value Ref Range Status  07/01/2013 10:55 AM 241.1* 0 - 125 pg/mL Final   Fasting Lipid Panel:  Recent Labs  07/02/13 0415  CHOL 146  HDL 36*  LDLCALC 49  TRIG 305*  CHOLHDL 4.1   Thyroid Function Tests:  Recent Labs  07/01/13 1236  TSH 1.660   TELE:   Atrial flutter, controlled VR    Radiology/Studies: Nm Myocar Multi W/spect W/wall Motion / Ef 07/02/2013   CLINICAL DATA:  Chest pain  EXAM: MYOCARDIAL IMAGING WITH SPECT (REST AND PHARMACOLOGIC-STRESS - 2 DAY PROTOCOL)  GATED LEFT VENTRICULAR WALL MOTION STUDY  LEFT VENTRICULAR EJECTION FRACTION  TECHNIQUE: Standard myocardial SPECT imaging was performed after resting intravenous injection of 10 mCi Tc-51m sestamibi. Subsequently, on a second day, intravenous infusion of Lexiscan was performed under the supervision of the Cardiology staff. At peak effect of the drug, 30 mCi Tc-38m sestamibi was injected intravenously and standard myocardial SPECT imaging was performed. Quantitative gated imaging was also performed to evaluate left ventricular wall motion, and estimate left ventricular ejection fraction.  COMPARISON:  06/27/2012  FINDINGS: Myocardial perfusion study: Subtly reduced activity in the inferolateral wall on stress images compared to rest images near the cardiac apex, borderline for inducible ischemia quantitatively. In the more basilar region, there is reduced perfusion in this inferolateral region on both  stress and rest imaging, suggesting mild scarring.  Ejection fraction calculation: End-diastolic volume is 69 mL. End systolic volume is 27 mL. Derived left ventricular ejection fraction of 57%.  Wall motion analysis: Minimal anterolateral hypokinesis.  IMPRESSION: 1. Borderline appearance for inducible ischemia along the inferolateral wall near the cardiac apex. Careful correlation with ECG results suggested in determining the need for further follow-up. There may be some mild scarring along the basilar and margin of this potential lesion.   Electronically Signed   By: Sherryl Barters M.D.   On: 07/02/2013 15:25   Dg Chest Port 1 View 07/01/2013   CLINICAL DATA:  Chest pain  EXAM: PORTABLE CHEST - 1 VIEW  COMPARISON:  06/25/2012  FINDINGS: Cardiac shadow is mildly prominent. There is elevation the right hemidiaphragm again noted. Mild vascular congestion is seen without pulmonary edema. No focal infiltrate is noted.  IMPRESSION: Mild central vascular congestion.   Electronically Signed   By: Inez Catalina M.D.   On: 07/01/2013 11:44     Current Medications:  . aspirin  81 mg Oral Daily  . carvedilol  25 mg Oral BID WC  . cloNIDine  0.3 mg Oral Daily  . diltiazem  120 mg Oral Daily  . insulin aspart  0-15 Units Subcutaneous TID WC  . insulin aspart  0-5 Units Subcutaneous QHS  . lisinopril  20 mg Oral Daily  . pravastatin  40 mg Oral q1800  . rivaroxaban  20 mg Oral Q supper      ASSESSMENT AND PLAN: Principal Problem:   Atrial flutter - rate is controlled, continue diltiazem, no pauses > 3 sec. He feels palpitations at times, hopefully will take diltiazem as OP. Pt refuses TEE/DCCV. Since med compliance is an issue, this is OK.  Active Problems:   Ulcer of lower limb, unspecified- Bilateral great toe - put WOC instructions on d/c sheet.      Hypomagnesemia - will add oral med    Thrombocytopenia - follow, no symptoms    Diabetes mellitus, type II - mgt per primary MD    Non  compliance w medication regimen - does not take meds consistently, per his mother.      Abnl ECG - not a true STEMI w/ atrial flutter. No chest pain. MV was mildly abnl, low-risk study. Appropriate to encourage CRF modification and f/u in office with Dr. Claiborne Billings. He is on ASA, BB, statin, ACE.  Plan - d/c today.  Jonetta Speak , PA-C 8:29 AM 07/03/2013

## 2013-07-04 NOTE — Discharge Summary (Signed)
Agree with discharge summary as outlined by Rosaria Ferries, PA-C

## 2013-09-12 ENCOUNTER — Encounter: Payer: Self-pay | Admitting: Cardiovascular Disease

## 2013-09-12 ENCOUNTER — Telehealth: Payer: Self-pay | Admitting: Cardiovascular Disease

## 2013-09-15 NOTE — Telephone Encounter (Signed)
Closed encounter °

## 2013-09-25 ENCOUNTER — Ambulatory Visit: Payer: Medicare Other | Admitting: Cardiovascular Disease

## 2013-11-11 ENCOUNTER — Ambulatory Visit (INDEPENDENT_AMBULATORY_CARE_PROVIDER_SITE_OTHER): Payer: Medicare Other | Admitting: Cardiovascular Disease

## 2013-11-11 ENCOUNTER — Encounter: Payer: Self-pay | Admitting: Cardiovascular Disease

## 2013-11-11 VITALS — BP 140/80 | HR 95 | Ht 70.5 in | Wt 273.0 lb

## 2013-11-11 DIAGNOSIS — E1151 Type 2 diabetes mellitus with diabetic peripheral angiopathy without gangrene: Secondary | ICD-10-CM

## 2013-11-11 DIAGNOSIS — I739 Peripheral vascular disease, unspecified: Secondary | ICD-10-CM

## 2013-11-11 DIAGNOSIS — I4892 Unspecified atrial flutter: Secondary | ICD-10-CM

## 2013-11-11 DIAGNOSIS — Z7901 Long term (current) use of anticoagulants: Secondary | ICD-10-CM

## 2013-11-11 LAB — CBC
HEMATOCRIT: 48.2 % (ref 39.0–52.0)
HEMOGLOBIN: 16 g/dL (ref 13.0–17.0)
MCH: 29.5 pg (ref 26.0–34.0)
MCHC: 33.2 g/dL (ref 30.0–36.0)
MCV: 88.9 fL (ref 78.0–100.0)
Platelets: 166 10*3/uL (ref 150–400)
RBC: 5.42 MIL/uL (ref 4.22–5.81)
RDW: 13.2 % (ref 11.5–15.5)
WBC: 9.9 10*3/uL (ref 4.0–10.5)

## 2013-11-11 MED ORDER — DILTIAZEM HCL ER COATED BEADS 240 MG PO CP24: 240.0000 mg | ORAL_CAPSULE | Freq: Every day | ORAL | Status: DC

## 2013-11-11 NOTE — Patient Instructions (Signed)
Your physician has recommended you make the following change in your medication: increase the diltiazem from 120 mg daily to 240 mg daily. You can take (2) of the current pills at once until completed then start new prescription and take 1 tablet daily.  Your physician recommends that you return for lab work today. Your physician recommends that you schedule a follow-up appointment in: 2 months with Dr. Claiborne Billings.

## 2013-11-11 NOTE — Progress Notes (Signed)
Patient ID: Samuel Mccoy, male   DOB: 15-Nov-1950, 63 y.o.   MRN: [id number]     HPI: Samuel Mccoy is a 63 y.o. male who presents to the office today for a  cardiology evaluation following his hospitalization in June 2015.  Samuel Mccoy is a 63 year old white male who has a history of atrial fibrillation which had been managed by his primary care physician, Dr. Henrene Pastor.  There is no history of known CAD, PVD.  A year ago.  He was seen by Dr. Doren Custard for evaluation of nonhealing wounds of his feet bilaterally.  In June he was transported to [facility] after he had an ECG at an outpatient clinic, which revealed tachycardia up to 150 and concern for possible code STEMI.  He was brought to [facility], but his ECG actually showed atrial flutter with variable block and he was not having an ST segment elevation MI.  He received Cardizem 10 mg bolus by EMS in a flutter improved from 2141.  A 2-D echo Doppler study showed an EF of 50-55% without wall motion abnormalities.  There was severe concentric LVH.  His left atrium was moderately dilated in his right atrium was mildly dilated. It was felt that the duration of his atrial flutter was unknown.  There was discussion concerning TEE cardioversion, however the patient refused the procedure.  He was discharged on 07/03/2013.  He now presents for evaluation.  He is unaware of his heart rhythm.  He denies chest pain.  He does admit to leg swelling.  Past Medical History  Diagnosis Date  . Ulcer April 2014    Bilateral foot  . Diabetes mellitus, type II   . Diabetic neuropathy     Bilateral foot  . Peripheral vascular disease   . Coronary atherosclerosis of unspecified type of vessel, native or graft   . Hyperlipidemia   . Obese   . Atrial fibrillation   . Hypertension   . Paranoid type delusional disorder   . Chronic anticoagulation     Xarelto  . Thrombocytopenia   . Non compliance w medication regimen     Per his mother    Past Surgical  History  Procedure Laterality Date  . Tonsilectomy, adenoidectomy, bilateral myringotomy and tubes    . Appendectomy    . Left heart cath  2000  . Cardiovascular stress test  July 2014    No Known Allergies  Current Outpatient Prescriptions  Medication Sig Dispense Refill  . aspirin EC 81 MG tablet Take 81 mg by mouth daily.      . carvedilol (COREG) 25 MG tablet Take 25 mg by mouth 2 (two) times daily with a meal.      . cloNIDine (CATAPRES) 0.3 MG tablet Take 0.3 mg by mouth daily.      Marland Kitchen glyBURIDE-metformin (GLUCOVANCE) 5-500 MG per tablet Take 1 tablet by mouth 2 (two) times daily with a meal.       . hydrochlorothiazide (HYDRODIURIL) 25 MG tablet Take 25 mg by mouth daily.      Marland Kitchen lisinopril (PRINIVIL,ZESTRIL) 20 MG tablet Take 1 tablet (20 mg total) by mouth daily.  30 tablet  11  . magnesium oxide (MAG-OX) 400 (241.3 MG) MG tablet Take 1 tablet (400 mg total) by mouth 2 (two) times daily.  30 tablet  11  . Multiple Vitamins-Minerals (ICAPS) CAPS Take 1 capsule by mouth daily.      . pravastatin (PRAVACHOL) 40 MG tablet Take 40 mg by mouth  daily.      . rivaroxaban (XARELTO) 20 MG TABS tablet Take 1 tablet (20 mg total) by mouth daily with supper.  30 tablet  11  . vitamin B-12 (CYANOCOBALAMIN) 1000 MCG tablet Take 1,000 mcg by mouth daily.      Marland Kitchen diltiazem (CARDIZEM CD) 240 MG 24 hr capsule Take 1 capsule (240 mg total) by mouth daily.  90 capsule  3   No current facility-administered medications for this visit.    History   Social History  . Marital Status: Single    Spouse Name: N/A    Number of Children: N/A  . Years of Education: N/A   Occupational History  . Not on file.   Social History Main Topics  . Smoking status: Never Smoker   . Smokeless tobacco: Never Used  . Alcohol Use: No  . Drug Use: No  . Sexual Activity: Not on file   Other Topics Concern  . Not on file   Social History Narrative  . No narrative on file    Family History  Problem Relation  Age of Onset  . Diabetes Father   . Heart disease Father     Heart Disease before age 87  . Cancer Father   . Hyperlipidemia Father   . Hypertension Father   . Heart attack Father   . Hypertension Mother   . Cancer Mother   . Deep vein thrombosis Mother     ROS General: Negative; No fevers, chills, or night sweats HEENT: Negative; No changes in vision or hearing, sinus congestion, difficulty swallowing Pulmonary: Negative; No cough, wheezing, shortness of breath, hemoptysis Cardiovascular: See HPI: No chest pain, presyncope, syncope, palpatations Positive for leg swelling GI: Negative; No nausea, vomiting, diarrhea, or abdominal pain GU: Negative; No dysuria, hematuria, or difficulty voiding Musculoskeletal: Negative; no myalgias, joint pain, or weakness Hematologic: Negative; no easy bruising, bleeding Endocrine: Positive for diabetes mellitus; no heat/cold intolerance; Neuro: Negative; no changes in balance, headaches Skin: Positive for diabetic ulcer of his right great toe No rashes or skin lesions Psychiatric: Negative; No behavioral problems, depression Sleep: Negative; No snoring,  daytime sleepiness, hypersomnolence, bruxism, restless legs, hypnogognic hallucinations. Other comprehensive 14 point system review is negative   Physical Exam BP 140/80  Pulse 95  Ht 5' 10.5" (1.791 m)  Wt 273 lb 0.5 oz (123.846 kg)  BMI 38.61 kg/m2 General: Alert, oriented, no distress.  Skin: normal turgor, no rashes, warm and dry HEENT: Normocephalic, atraumatic. Pupils equal round and reactive to light; sclera anicteric; extraocular muscles intact, No lid lag; Nose without nasal septal hypertrophy; Mouth/Parynx benign; Mallinpatti scale 3 Neck: No JVD, no carotid bruits; normal carotid upstroke Lungs: clear to ausculatation and percussion bilaterally; no wheezing or rales, normal inspiratory and expiratory effort Chest wall: without tenderness to palpitation Heart: PMI not displaced,  RRR with rate in the 90s, s1 s2 normal, 1/6systolic murmur, No diastolic murmur, no rubs, gallops, thrills, or heaves Abdomen: soft, nontender; no hepatosplenomehaly, BS+; abdominal aorta nontender and not dilated by palpation. Back: no CVA tenderness Pulses: 2+  Musculoskeletal: full range of motion, normal strength, no joint deformities Extremities: Pulses 2+, 1+ tense lower extremity edema bilaterally with venous stasis changes; diabetic ulcer on his right great toe;  Homan's sign negative  Neurologic: grossly nonfocal; Cranial nerves grossly wnl Psychologic: Normal mood    ECG (independently read by me): Atrial flutter with variable block.  Heart rate averaging 95 bpm  LABS:  BMET    Component Value  Date/Time   NA 136 11/11/2013 1031   K 4.2 11/11/2013 1031   CL 96 11/11/2013 1031   CO2 31 11/11/2013 1031   GLUCOSE 333* 11/11/2013 1031   BUN 15 11/11/2013 1031   CREATININE 0.91 11/11/2013 1031   CREATININE 1.22 07/02/2013 0415   CALCIUM 9.5 11/11/2013 1031   GFRNONAA 62* 07/02/2013 0415   GFRAA 72* 07/02/2013 0415     Hepatic Function Panel     Component Value Date/Time   PROT 7.7 11/11/2013 1031   ALBUMIN 4.6 11/11/2013 1031   AST 39* 11/11/2013 1031   ALT 39 11/11/2013 1031   ALKPHOS 88 11/11/2013 1031   BILITOT 0.6 11/11/2013 1031     CBC    Component Value Date/Time   WBC 9.9 11/11/2013 1031   RBC 5.42 11/11/2013 1031   HGB 16.0 11/11/2013 1031   HCT 48.2 11/11/2013 1031   PLT 166 11/11/2013 1031   MCV 88.9 11/11/2013 1031   MCH 29.5 11/11/2013 1031   MCHC 33.2 11/11/2013 1031   RDW 13.2 11/11/2013 1031     BNP    Component Value Date/Time   PROBNP 241.1* 07/01/2013 1055    Lipid Panel     Component Value Date/Time   CHOL 146 07/02/2013 0415   TRIG 305* 07/02/2013 0415   HDL 36* 07/02/2013 0415   CHOLHDL 4.1 07/02/2013 0415   VLDL 61* 07/02/2013 0415   LDLCALC 49 07/02/2013 0415     RADIOLOGY: No results found.    ASSESSMENT AND  PLAN: Mr. Samuel Mccoy and is a 63 year old gentleman who remotely had a history of possible atrial fibrillation.  In June he was transported to [facility] with atrial flutter in 2-1 block at a heart rate of 1 50 bpm.  He has been on anticoagulation since that time and currently is on Xarelto, 20 mg without bleeding issues.  He is on diltiazem 120 mg and carvedilol 25 g twice a day for rate control and also is taking lisinopril 20 mg in addition to HCTZ 25 mg for blood pressure and leg swelling.  He has no interest to pursue attempt at restoration of sinus rhythm.  I discussed potential cardioversion as well as potential atrial flutter ablation.  He states he feels well.  I am recommending further titration of his Cardizem to 240 mg daily for improved rate control.  Lab work will be checked, consisting of a comprehensive metabolic panel, magnesium level, thyroid function studies, and CBC.  He may need more aggressive diuretic.  He will follow-up with Dr. Henrene Pastor. over the next several weeks.  I will see him in 2 months for cardiology reevaluation     Troy Sine, MD, [facility]  11/15/2013 2:07 PM

## 2013-11-12 ENCOUNTER — Ambulatory Visit: Payer: Medicare Other | Admitting: Cardiovascular Disease

## 2013-11-12 LAB — TSH: TSH: 1.451 u[IU]/mL (ref 0.350–4.500)

## 2013-11-12 LAB — COMPREHENSIVE METABOLIC PANEL
ALBUMIN: 4.6 g/dL (ref 3.5–5.2)
ALT: 39 U/L (ref 0–53)
AST: 39 U/L — ABNORMAL HIGH (ref 0–37)
Alkaline Phosphatase: 88 U/L (ref 39–117)
BILIRUBIN TOTAL: 0.6 mg/dL (ref 0.2–1.2)
BUN: 15 mg/dL (ref 6–23)
CO2: 31 meq/L (ref 19–32)
Calcium: 9.5 mg/dL (ref 8.4–10.5)
Chloride: 96 mEq/L (ref 96–112)
Creat: 0.91 mg/dL (ref 0.50–1.35)
GLUCOSE: 333 mg/dL — AB (ref 70–99)
Potassium: 4.2 mEq/L (ref 3.5–5.3)
Sodium: 136 mEq/L (ref 135–145)
Total Protein: 7.7 g/dL (ref 6.0–8.3)

## 2013-11-12 LAB — MAGNESIUM: MAGNESIUM: 1.6 mg/dL (ref 1.5–2.5)

## 2013-11-15 ENCOUNTER — Encounter: Payer: Self-pay | Admitting: Cardiovascular Disease

## 2013-11-15 DIAGNOSIS — Z7901 Long term (current) use of anticoagulants: Secondary | ICD-10-CM | POA: Insufficient documentation

## 2013-12-04 ENCOUNTER — Encounter: Payer: Self-pay | Admitting: *Deleted

## 2013-12-04 ENCOUNTER — Telehealth: Payer: Self-pay | Admitting: *Deleted

## 2013-12-04 NOTE — Telephone Encounter (Signed)
-----   Message from Troy Sine, MD sent at 11/30/2013  8:48 PM EST ----- Needs better glu control of DM; refer back to primary MD

## 2013-12-04 NOTE — Telephone Encounter (Signed)
Left message with patient's mother to return a call to me on Monday.

## 2013-12-09 ENCOUNTER — Telehealth: Payer: Self-pay | Admitting: Cardiovascular Disease

## 2013-12-09 NOTE — Telephone Encounter (Signed)
Pt was returning Wanda's call from 11/19. Please call  Thanks

## 2013-12-09 NOTE — Telephone Encounter (Signed)
Spoke to patient. RN informed patient . Dr Claiborne Billings reviewed labs . Labs okay except glucose. Will send to primary Dr Blanch Media office. Patient states he saw someone last week - hgA1c- 8. RN route labs to primary.

## 2013-12-11 ENCOUNTER — Encounter (HOSPITAL_COMMUNITY): Payer: Self-pay | Admitting: Internal Medicine

## 2013-12-11 ENCOUNTER — Inpatient Hospital Stay (HOSPITAL_COMMUNITY)
Admission: EM | Admit: 2013-12-11 | Discharge: 2013-12-12 | DRG: 309 | Disposition: A | Discharge status: Home / Self Care | Payer: Medicare Other | Source: Other Acute Inpatient Hospital | Attending: Internal Medicine | Admitting: Internal Medicine

## 2013-12-11 DIAGNOSIS — I4892 Unspecified atrial flutter: Principal | ICD-10-CM

## 2013-12-11 DIAGNOSIS — I10 Essential (primary) hypertension: Secondary | ICD-10-CM | POA: Insufficient documentation

## 2013-12-11 DIAGNOSIS — I4891 Unspecified atrial fibrillation: Secondary | ICD-10-CM | POA: Diagnosis present

## 2013-12-11 DIAGNOSIS — E1140 Type 2 diabetes mellitus with diabetic neuropathy, unspecified: Secondary | ICD-10-CM | POA: Diagnosis present

## 2013-12-11 DIAGNOSIS — Z7901 Long term (current) use of anticoagulants: Secondary | ICD-10-CM | POA: Insufficient documentation

## 2013-12-11 DIAGNOSIS — E785 Hyperlipidemia, unspecified: Secondary | ICD-10-CM | POA: Diagnosis present

## 2013-12-11 DIAGNOSIS — Z9114 Patient's other noncompliance with medication regimen: Secondary | ICD-10-CM

## 2013-12-11 DIAGNOSIS — F22 Delusional disorders: Secondary | ICD-10-CM | POA: Diagnosis present

## 2013-12-11 DIAGNOSIS — I248 Other forms of acute ischemic heart disease: Secondary | ICD-10-CM | POA: Diagnosis present

## 2013-12-11 DIAGNOSIS — Z8249 Family history of ischemic heart disease and other diseases of the circulatory system: Secondary | ICD-10-CM | POA: Diagnosis not present

## 2013-12-11 DIAGNOSIS — Z6837 Body mass index (BMI) 37.0-37.9, adult: Secondary | ICD-10-CM

## 2013-12-11 DIAGNOSIS — Y92019 Unspecified place in single-family (private) house as the place of occurrence of the external cause: Secondary | ICD-10-CM

## 2013-12-11 DIAGNOSIS — R7989 Other specified abnormal findings of blood chemistry: Secondary | ICD-10-CM

## 2013-12-11 DIAGNOSIS — Z7982 Long term (current) use of aspirin: Secondary | ICD-10-CM | POA: Diagnosis not present

## 2013-12-11 DIAGNOSIS — E119 Type 2 diabetes mellitus without complications: Secondary | ICD-10-CM

## 2013-12-11 DIAGNOSIS — I739 Peripheral vascular disease, unspecified: Secondary | ICD-10-CM | POA: Diagnosis present

## 2013-12-11 DIAGNOSIS — Z833 Family history of diabetes mellitus: Secondary | ICD-10-CM

## 2013-12-11 DIAGNOSIS — T461X6A Underdosing of calcium-channel blockers, initial encounter: Secondary | ICD-10-CM | POA: Diagnosis present

## 2013-12-11 DIAGNOSIS — Z91148 Patient's other noncompliance with medication regimen for other reason: Secondary | ICD-10-CM

## 2013-12-11 DIAGNOSIS — R778 Other specified abnormalities of plasma proteins: Secondary | ICD-10-CM | POA: Diagnosis present

## 2013-12-11 DIAGNOSIS — Z9861 Coronary angioplasty status: Secondary | ICD-10-CM | POA: Diagnosis not present

## 2013-12-11 DIAGNOSIS — I2510 Atherosclerotic heart disease of native coronary artery without angina pectoris: Secondary | ICD-10-CM | POA: Diagnosis present

## 2013-12-11 LAB — TROPONIN I
TROPONIN I: 0.84 ng/mL — AB (ref <0.30)
TROPONIN I: 0.93 ng/mL — AB (ref <0.30)
TROPONIN I: 0.78 ng/mL — AB (ref <0.30)

## 2013-12-11 LAB — CBC
HEMATOCRIT: 47.6 % (ref 39.0–52.0)
Hemoglobin: 16.2 g/dL (ref 13.0–17.0)
MCH: 29.8 pg (ref 26.0–34.0)
MCHC: 34 g/dL (ref 30.0–36.0)
MCV: 87.5 fL (ref 78.0–100.0)
Platelets: 107 10*3/uL — ABNORMAL LOW (ref 150–400)
RBC: 5.44 MIL/uL (ref 4.22–5.81)
RDW: 13.5 % (ref 11.5–15.5)
WBC: 7.7 10*3/uL (ref 4.0–10.5)

## 2013-12-11 LAB — BASIC METABOLIC PANEL
ANION GAP: 16 — AB (ref 5–15)
BUN: 20 mg/dL (ref 6–23)
CHLORIDE: 100 meq/L (ref 96–112)
CO2: 24 mEq/L (ref 19–32)
Calcium: 9.1 mg/dL (ref 8.4–10.5)
Creatinine, Ser: 0.78 mg/dL (ref 0.50–1.35)
Glucose, Bld: 193 mg/dL — ABNORMAL HIGH (ref 70–99)
POTASSIUM: 4.1 meq/L (ref 3.7–5.3)
SODIUM: 140 meq/L (ref 137–147)

## 2013-12-11 LAB — GLUCOSE, CAPILLARY
Glucose-Capillary: 180 mg/dL — ABNORMAL HIGH (ref 70–99)
Glucose-Capillary: 273 mg/dL — ABNORMAL HIGH (ref 70–99)
Glucose-Capillary: 258 mg/dL — ABNORMAL HIGH (ref 70–99)
Glucose-Capillary: 222 mg/dL — ABNORMAL HIGH (ref 70–99)

## 2013-12-11 LAB — MRSA PCR SCREENING: MRSA by PCR: NEGATIVE

## 2013-12-11 MED ORDER — ONDANSETRON HCL 4 MG/2ML IJ SOLN: 4.0000 mg | Freq: Four times a day (QID) | INTRAMUSCULAR | Status: DC | PRN

## 2013-12-11 MED ORDER — HYDRALAZINE HCL 10 MG PO TABS
10.0000 mg | ORAL_TABLET | Freq: Three times a day (TID) | ORAL | Status: DC
  Administered 2013-12-11 – 2013-12-12 (×4): 10 mg via ORAL
  Filled 2013-12-11 (×4): qty 1

## 2013-12-11 MED ORDER — HYDROCHLOROTHIAZIDE 25 MG PO TABS
25.0000 mg | ORAL_TABLET | Freq: Every day | ORAL | Status: DC
  Administered 2013-12-11 – 2013-12-12 (×2): 25 mg via ORAL
  Filled 2013-12-11 (×2): qty 1

## 2013-12-11 MED ORDER — DILTIAZEM HCL 100 MG IV SOLR
5.0000 mg/h | INTRAVENOUS | Status: DC
  Administered 2013-12-11 – 2013-12-12 (×2): 5 mg/h via INTRAVENOUS
  Filled 2013-12-11: qty 100

## 2013-12-11 MED ORDER — ENOXAPARIN SODIUM 40 MG/0.4ML ~~LOC~~ SOLN: 40.0000 mg | SUBCUTANEOUS | Status: DC

## 2013-12-11 MED ORDER — LISINOPRIL 20 MG PO TABS
20.0000 mg | ORAL_TABLET | Freq: Every day | ORAL | Status: DC
  Administered 2013-12-11 – 2013-12-12 (×2): 20 mg via ORAL
  Filled 2013-12-11 (×2): qty 1

## 2013-12-11 MED ORDER — ACETAMINOPHEN 325 MG PO TABS: 650.0000 mg | ORAL_TABLET | ORAL | Status: DC | PRN

## 2013-12-11 MED ORDER — DILTIAZEM LOAD VIA INFUSION
15.0000 mg | Freq: Once | INTRAVENOUS | Status: AC
  Administered 2013-12-11: 15 mg via INTRAVENOUS
  Filled 2013-12-11: qty 15

## 2013-12-11 MED ORDER — ASPIRIN EC 81 MG PO TBEC
81.0000 mg | DELAYED_RELEASE_TABLET | Freq: Every day | ORAL | Status: DC
  Administered 2013-12-11 – 2013-12-12 (×2): 81 mg via ORAL
  Filled 2013-12-11 (×2): qty 1

## 2013-12-11 MED ORDER — CARVEDILOL 25 MG PO TABS
25.0000 mg | ORAL_TABLET | Freq: Two times a day (BID) | ORAL | Status: DC
  Administered 2013-12-11 – 2013-12-12 (×3): 25 mg via ORAL
  Filled 2013-12-11 (×3): qty 1

## 2013-12-11 MED ORDER — INSULIN ASPART 100 UNIT/ML ~~LOC~~ SOLN
0.0000 [IU] | Freq: Three times a day (TID) | SUBCUTANEOUS | Status: DC
  Administered 2013-12-11: 5 [IU] via SUBCUTANEOUS
  Administered 2013-12-11: 2 [IU] via SUBCUTANEOUS
  Administered 2013-12-11: 5 [IU] via SUBCUTANEOUS
  Administered 2013-12-12: 2 [IU] via SUBCUTANEOUS
  Administered 2013-12-12: 3 [IU] via SUBCUTANEOUS

## 2013-12-11 MED ORDER — CLONIDINE HCL 0.3 MG PO TABS
0.3000 mg | ORAL_TABLET | Freq: Every day | ORAL | Status: DC
  Administered 2013-12-11 – 2013-12-12 (×2): 0.3 mg via ORAL
  Filled 2013-12-11 (×2): qty 1

## 2013-12-11 MED ORDER — PRAVASTATIN SODIUM 40 MG PO TABS
40.0000 mg | ORAL_TABLET | Freq: Every day | ORAL | Status: DC
  Administered 2013-12-11: 40 mg via ORAL
  Filled 2013-12-11: qty 1

## 2013-12-11 MED ORDER — INSULIN ASPART 100 UNIT/ML ~~LOC~~ SOLN
0.0000 [IU] | Freq: Every day | SUBCUTANEOUS | Status: DC
  Administered 2013-12-11: 2 [IU] via SUBCUTANEOUS

## 2013-12-11 MED ORDER — DILTIAZEM HCL ER COATED BEADS 240 MG PO CP24: 240.0000 mg | ORAL_CAPSULE | Freq: Every day | ORAL | Status: DC

## 2013-12-11 MED ORDER — RIVAROXABAN 20 MG PO TABS
20.0000 mg | ORAL_TABLET | Freq: Every day | ORAL | Status: DC
  Administered 2013-12-11: 20 mg via ORAL
  Filled 2013-12-11: qty 1

## 2013-12-11 MED ORDER — NITROGLYCERIN 0.4 MG SL SUBL: 0.4000 mg | SUBLINGUAL_TABLET | SUBLINGUAL | Status: DC | PRN

## 2013-12-11 NOTE — Discharge Instructions (Signed)
Information on my medicine - XARELTO (Rivaroxaban)  This medication education was reviewed with me or my healthcare representative as part of my discharge preparation.  The pharmacist that spoke with me during my hospital stay was:  Chloe Miyoshi, Julaine Hua, [facility]  Why was Xarelto prescribed for you? Xarelto was prescribed for you to reduce the risk of a blood clot forming that can cause a stroke if you have a medical condition called atrial fibrillation (a type of irregular heartbeat).  What do you need to know about xarelto ? Take your Xarelto ONCE DAILY at the same time every day with your evening meal. If you have difficulty swallowing the tablet whole, you may crush it and mix in applesauce just prior to taking your dose.  Take Xarelto exactly as prescribed by your doctor and DO NOT stop taking Xarelto without talking to the doctor who prescribed the medication.  Stopping without other stroke prevention medication to take the place of Xarelto may increase your risk of developing a clot that causes a stroke.  Refill your prescription before you run out.  After discharge, you should have regular check-up appointments with your healthcare provider that is prescribing your Xarelto.  In the future your dose may need to be changed if your kidney function or weight changes by a significant amount.  What do you do if you miss a dose? If you are taking Xarelto ONCE DAILY and you miss a dose, take it as soon as you remember on the same day then continue your regularly scheduled once daily regimen the next day. Do not take two doses of Xarelto at the same time or on the same day.   Important Safety Information A possible side effect of Xarelto is bleeding. You should call your healthcare provider right away if you experience any of the following: ? Bleeding from an injury or your nose that does not stop. ? Unusual colored urine (red or dark brown) or unusual colored stools (red or black). ? Unusual  bruising for unknown reasons. ? A serious fall or if you hit your head (even if there is no bleeding).  Some medicines may interact with Xarelto and might increase your risk of bleeding while on Xarelto. To help avoid this, consult your healthcare provider or pharmacist prior to using any new prescription or non-prescription medications, including herbals, vitamins, non-steroidal anti-inflammatory drugs (NSAIDs) and supplements.  This website has more information on Xarelto: [email or web address]

## 2013-12-11 NOTE — Plan of Care (Signed)
Problem: Phase I Progression Outcomes Goal: Hemodynamically stable Outcome: Completed/Met Date Met:  12/11/13  Problem: Phase II Progression Outcomes Goal: Progress activity as tolerated unless otherwise ordered Outcome: Completed/Met Date Met:  12/11/13 Goal: Vital signs remain stable Outcome: Completed/Met Date Met:  12/11/13

## 2013-12-11 NOTE — Plan of Care (Signed)
Problem: Consults Goal: General Medical Patient Education See Patient Education Module for specific education.  Outcome: Progressing Goal: Nutrition Consult-if indicated Outcome: Not Applicable Date Met:  88/87/57  Problem: Phase I Progression Outcomes Goal: Pain controlled with appropriate interventions Outcome: Completed/Met Date Met:  12/11/13 Goal: OOB as tolerated unless otherwise ordered Outcome: Completed/Met Date Met:  12/11/13 Goal: Voiding-avoid urinary catheter unless indicated Outcome: Completed/Met Date Met:  12/11/13

## 2013-12-11 NOTE — Progress Notes (Signed)
TELEMETRY: Reviewed telemetry pt in Atrial flutter with RVR rate 130 bpm: Filed Vitals:   12/11/13 0000 12/11/13 0500  BP: 174/103 191/100  Pulse: 106 105  Temp: 98.6 F (37 C) 98 F (36.7 C)  TempSrc: Oral Oral  Resp: 20 20  Height: 5\' 11"  (1.803 m)   Weight: 271 lb 11.2 oz (123.242 kg)   SpO2: 100% 98%    Intake/Output Summary (Last 24 hours) at 12/11/13 0823 Last data filed at 12/11/13 0400  Gross per 24 hour  Intake    240 ml  Output    400 ml  Net   -160 ml   Filed Weights   12/11/13 0000  Weight: 271 lb 11.2 oz (123.242 kg)    Subjective Patient denies any chest pain or SOB. Denies palpitations. States he came to hospital because he broke out in hives. States he did not take his evening meds yesterday.  Marland Kitchen aspirin EC  81 mg Oral Daily  . carvedilol  25 mg Oral BID WC  . cloNIDine  0.3 mg Oral Daily  . diltiazem  15 mg Intravenous Once  . hydrALAZINE  10 mg Oral 3 times per day  . hydrochlorothiazide  25 mg Oral Daily  . insulin aspart  0-5 Units Subcutaneous QHS  . insulin aspart  0-9 Units Subcutaneous TID WC  . lisinopril  20 mg Oral Daily  . pravastatin  40 mg Oral q1800  . rivaroxaban  20 mg Oral Q supper   . diltiazem (CARDIZEM) infusion      LABS: Basic Metabolic Panel:  Recent Labs  12/11/13 0616  NA 140  K 4.1  CL 100  CO2 24  GLUCOSE 193*  BUN 20  CREATININE 0.78  CALCIUM 9.1   Liver Function Tests: No results for input(s): AST, ALT, ALKPHOS, BILITOT, PROT, ALBUMIN in the last 72 hours. No results for input(s): LIPASE, AMYLASE in the last 72 hours. CBC:  Recent Labs  12/11/13 0616  WBC 7.7  HGB 16.2  HCT 47.6  MCV 87.5  PLT PENDING   Cardiac Enzymes:  Recent Labs  12/11/13 0616  TROPONINI 0.84*   Ecg: atrial flutter with RVR. LAD. I have personally reviewed and interpreted this study.   Radiology/Studies:  No results found.  PHYSICAL EXAM General: morbidly obese, in no acute distress. Head: Normocephalic,  atraumatic, sclera non-icteric, oropharynx is clear Neck: Negative for carotid bruits. JVD not elevated. No adenopathy Lungs: Clear bilaterally to auscultation without wheezes, rales, or rhonchi. Breathing is unlabored. Heart: tachycardic,  S1 S2 without murmurs, rubs, or gallops.  Abdomen: Soft, non-tender, non-distended with normoactive bowel sounds. No hepatomegaly. No rebound/guarding. No obvious abdominal masses. Msk:  Strength and tone appears normal for age. Extremities: 1+ edema with chronic stasis skin changes.  Neuro: Alert and oriented X 3. Moves all extremities spontaneously. Psych:  Responds to questions appropriately with a normal affect.  ASSESSMENT AND PLAN: 1. Atrial flutter with RVR. Patient reports compliance with meds until last night. States heart rate usually controlled with meds. Does not want to consider DCCV or ablation. Will continue Xarelto and beta blocker. Switch diltiazem to IV and titrate for rate control then can transition to po. Echo ordered.  2. Elevated troponin. Probably demand ischemia in setting of atrial flutter with RVR. Low risk stress test in June. No angina. Cycle cardiac enzymes and continue current meds. 3. CAD with remote PCI in the 1990s 4. HTN poorly controlled. Change diltiazem to IV. Add hydralazine po to regimen.  5. Dyslipidemia. 6. Morbid obesity.  Present on Admission:  . Elevated troponin  Signed, Kwamaine Cuppett [place], [place] 12/11/2013 8:23 AM

## 2013-12-11 NOTE — H&P (Signed)
CARDIOLOGY H&P NOTE  Assessment and Plan:  *Elevated cardiac biomarkers: Patient had minimal troponin elevation to 0.1 at outside hospital in the setting of atrial flutter with rapid ventricular response, severe LVH, possible coronary artery disease. This is possibly suggestive of type II non-ST elevation microinfarction. -- Control heart rate and blood pressure -- Cycle Cardec enzymes until trending down -- As patient is fairly symptomatic and recent stress test suggested possibly lowers ischemia, recommend conservative management. -- Monitor on telemetry -- Do not believe patient's having a plaque rupture event, we will hold off on starting heparin.  *Atrial tachyarrhythmia (Cavoisthmus-dependent atrial flutter): Reviewing his EKGs at [facility], patient appears to have typical flutter. Patient was offered ablation and/or cardioversion in June 2015 during his initial diagnosis; however, patient continues to refuse that even at present. He would rather continue medications and systemic anticoagulants. -- Restart carvedilol 25 mg by mouth twice a day, clonidine 0.3 by mouth daily, diltiazem CD 240 mg daily -- Patient is on once a day clonidine 0.3 by mouth daily. Discussed this further with his primary care provider was been managing his antihypertensives. Patient may like to benefit from more frequent clonidine dosing to avoid rebound hypertension. -- Continue Xarelto 20 mg daily  *CAD status post PCI: Patient reportedly had a percutaneous coronary intervention in 1990s. Currently asymptomatic -- Continue aspirin 81 mg daily -- Continue carvedilol 25 mg by mouth twice a day -- Continue pravastatin 40 mg daily. We discuss possibly being on high intensity statin but patient is reluctant to make any medication changes at this time.  *Hypertension: With evidence of enzymes organ damage with severe LVH noted on the echocardiogram in June. -- Continue home medications and titrate as  necessary.  *Dyslipidemia: Recommended high intensity statin; however, patient is reluctant to change any medications at this time. Continue pravastatin for now.   Primary cardiologist: Dr. Claiborne Billings  Chief complaint:  Elevated troponins   HPI:  Samuel Mccoy is a 63 year old male with history of CAD status post PCI, atrial flutter, possibly atrial fibrillation, paranoid type delusional disorder, hypertension, was transfer from [facility]. Patient reportedly went to the [facility] this evening with symptoms of urticaria per the ED physician. Patient reportedly had eaten "something unusual". In the emergency department patient received Benadryl and Pepcid with improvement i in pruritus symptoms. Patient subsequently had an EKG that showed atrial flutter with rapid ventricular response with a heart rate of 115-130. Patient had routine blood work that showed troponin of 0.133 and a white blood cell count of 14,000. Initially per ED M.D., patient reportedly had chest tightness in the emergency department, which is why patient was transferred to the [facility]. Upon arrival, patient is feeling fairly well. He in fact denies any chest pressure, chest pain, shortness of breath or recent symptoms of volume overload. He also denies any symptoms just of palpitations, dizziness, lightheadedness.  Of note, in June patient had a similar hospitalization when he was transferred for atrial flutter with rapid ventricular response. He subsequently had a stress test that was low risk. Patient was offered cardioversion and ablation; however, patient refused those interventions at that time. Patient continues to refuse those interventions even today. He prefers to continue to take rate controlling agents and systemic anticoagulation at this time.  Cardiac history:  CAD status post PCI 10 years ago  Possible atrial fibrillation  Atrial flutter  Hypertension  Dyslipidemia Previous cardiac  imaging  EKG 12/11/2013: Atrial flutter with rapid ventricular response  TTE:  LVEF 50-55% with severe LVH.   NM Stress test 06/2013: low risk. Borderline appearance for inducible ischemia along the inferolateral wall near the cardiac apex  ABI in 2014: Right 1.05, Left 1.02  Prior cath:   Past Medical History Past Medical History  Diagnosis Date  . Ulcer April 2014    Bilateral foot  . Diabetes mellitus, type II   . Diabetic neuropathy     Bilateral foot  . Peripheral vascular disease   . Coronary atherosclerosis of unspecified type of vessel, native or graft   . Hyperlipidemia   . Obese   . Atrial fibrillation   . Hypertension   . Paranoid type delusional disorder   . Chronic anticoagulation     Xarelto  . Thrombocytopenia   . Non compliance w medication regimen     Per his mother    Allergies: No Known Allergies  Social History History   Social History  . Marital Status: Single    Spouse Name: N/A    Number of Children: N/A  . Years of Education: N/A   Occupational History  . Not on file.   Social History Main Topics  . Smoking status: Never Smoker   . Smokeless tobacco: Never Used  . Alcohol Use: No  . Drug Use: No  . Sexual Activity: Not on file   Other Topics Concern  . Not on file   Social History Narrative    Family History Family History  Problem Relation Age of Onset  . Diabetes Father   . Heart disease Father     Heart Disease before age 60  . Cancer Father   . Hyperlipidemia Father   . Hypertension Father   . Heart attack Father   . Hypertension Mother   . Cancer Mother   . Deep vein thrombosis Mother     Physical Exam Filed Vitals:   12/11/13 0000  BP: 174/103  Pulse: 106  Temp: 98.6 F (37 C)  Resp: 20    Physical Exam  Labs:  General: Comfortable appearing, resting HEENT: Moist because membranes, normal oropharynx Neck: Supple, no JVD Cardiovascular: Regular rhythm, tachycardic, loud S1, normal S2,  S4 Pulmonary: Clear to bilaterally Extremities: No cyanosis clubbing or edema Abdomen: Soft, nontender, nondistended, obese Skin: No evidence of persistent urticarial rash.

## 2013-12-12 ENCOUNTER — Encounter (HOSPITAL_COMMUNITY): Payer: Self-pay | Admitting: Physician Assistant

## 2013-12-12 ENCOUNTER — Telehealth: Payer: Self-pay | Admitting: Cardiovascular Disease

## 2013-12-12 DIAGNOSIS — I4892 Unspecified atrial flutter: Secondary | ICD-10-CM

## 2013-12-12 DIAGNOSIS — I369 Nonrheumatic tricuspid valve disorder, unspecified: Secondary | ICD-10-CM

## 2013-12-12 DIAGNOSIS — I10 Essential (primary) hypertension: Secondary | ICD-10-CM | POA: Insufficient documentation

## 2013-12-12 DIAGNOSIS — Z7901 Long term (current) use of anticoagulants: Secondary | ICD-10-CM | POA: Insufficient documentation

## 2013-12-12 LAB — GLUCOSE, CAPILLARY
GLUCOSE-CAPILLARY: 167 mg/dL — AB (ref 70–99)
Glucose-Capillary: 245 mg/dL — ABNORMAL HIGH (ref 70–99)

## 2013-12-12 MED ORDER — CLONIDINE HCL 0.1 MG PO TABS: 0.3000 mg | ORAL_TABLET | Freq: Every day | ORAL | Status: DC

## 2013-12-12 MED ORDER — HYDRALAZINE HCL 10 MG PO TABS: 10.0000 mg | ORAL_TABLET | Freq: Three times a day (TID) | ORAL | Status: AC

## 2013-12-12 MED ORDER — DILTIAZEM HCL ER COATED BEADS 240 MG PO CP24
240.0000 mg | ORAL_CAPSULE | Freq: Every day | ORAL | Status: DC
  Administered 2013-12-12: 240 mg via ORAL
  Filled 2013-12-12: qty 1

## 2013-12-12 MED ORDER — CLONIDINE HCL 0.3 MG PO TABS: 0.3000 mg | ORAL_TABLET | Freq: Every day | ORAL | Status: AC

## 2013-12-12 NOTE — Progress Notes (Signed)
  Echocardiogram 2D Echocardiogram has been performed.  Kyo Cocuzza FRANCES 12/12/2013, 8:25 AM

## 2013-12-12 NOTE — Telephone Encounter (Signed)
New message    TCM appt on  12/9 at [facility]. Per Judye Bos.

## 2013-12-12 NOTE — Discharge Summary (Signed)
Discharge Summary   Patient ID: Samuel Mccoy MRN: [id number], DOB/AGE: March 11, 1950 63 y.o. Admit date: 12/11/2013 D/C date:     12/12/2013  Primary Cardiologist: Dr. Claiborne Billings   Principal Problem:   Atrial flutter, chronic Active Problems:   Peripheral vascular disease   Diabetes mellitus, type II   Non compliance w medication regimen   Elevated troponin   Essential hypertension   Chronic anticoagulation    Admission Dates:  12/11/13-12/12/13 Discharge Diagnosis: persistent atrial flutter with RVR s/p admission for rate control   HPI: Samuel Mccoy is a 62 y.o. male with a history of CAD s/p remote PCI (90s), atrial flutter, paranoid type delusional disorder, and hypertension who was transferred from to [facility] from [facility] on 12/11/13 for atrial flutter with RVR and minimally elevated troponin.    Patient reportedly had eaten "something unusual" and initially presented for pruritis . In the [facility] ED the patient received Benadryl and Pepcid with improvement i in pruritus symptoms. Patient subsequently had an EKG that showed atrial flutter with rapid ventricular response with a heart rate of 115-130. Patient had routine blood work that showed troponin of 0.133 and a white blood cell count of 14,000. Initially per ED M.D., patient reportedly had chest tightness in the emergency department, which is why patient was transferred to the [facility]. Upon arrival, patient was feeling fairly well and denied palpitations, chest pressure, chest pain, shortness of breath or recent symptoms of volume overload. Patient later reported that he had not been compliant with his Diltiazem at home.   Of note, in June patient had a similar hospitalization when he was transferred for atrial flutter with rapid ventricular response. He subsequently had a stress test that was low risk. Patient was offered cardioversion and ablation; however, patient refused those interventions at that  time. Patient continues to refuse those interventions even today. He prefers to continue to take rate controlling agents and systemic anticoagulation at this time.   Hospital Course  Elevated cardiac biomarkers: Patient had minimal troponin elevation to 0.1 at outside hospital in the setting of atrial flutter with rapid ventricular response, severe LVH, possible coronary artery disease.  -- Troponin flat. 0.84-> 0.93-->0.78 -- Probably demand ischemia in setting of atrial flutter with RVR. Low risk stress test in June. No angina.  -- Continue cardiac meds   Atrial flutter with RVR (Cavoisthmus-dependent atrial flutter): Patient was offered ablation and/or cardioversion in June 2015 during his initial diagnosis; however, patient continues to refuse that even at present. He would rather continue medications and systemic anticoagulants. -- Restarted on carvedilol 25 mg by mouth twice a day, clonidine 0.3 by mouth daily -- He was rate controlled on Dilt gtt which was converted to PO dilt CD 240mg  today. He remains adequately rate controlled.  -- Continue Xarelto 20 mg daily -- 2D ECHO with EF 60-65%, mod TR with RVSP of 46 mmHg, dilated IVC.  CAD status post PCI: Patient reportedly had a percutaneous coronary intervention in 1990s. Currently asymptomatic -- Elevated trop thought to be demand ischemia in setting of atrial flutter with RVR. Low risk stress test in June. No angina. -- Continue aspirin 81 mg, carvedilol 25 mg BID and pravastatin 40 mg daily.   HTN: With evidence of enzymes organ damage with severe LVH noted on the echocardiogram in June. -- Poorly controlled on carvedilol 25mg  BID, clonidine 0.3mg , hctz, lisinopril 20mg  and diltiazem. Hydralazine 10mg  TID added to his home regimen now with better control. --  He was rate controlled on Dilt gtt which was converted to PO dilt CD 240mg  today.  HLD: Continue statin   The patient has had an uncomplicated hospital course and is  recovering well. He has been seen by Dr. Meda Coffee today and deemed ready for discharge home. All follow-up appointments have been scheduled. Discharge medications are listed below.   Discharge Vitals: Blood pressure 152/72, pulse 100, temperature 97.6 F (36.4 C), temperature source Oral, resp. rate 20, height 5\' 11"  (1.803 m), weight 270 lb 8 oz (122.698 kg), SpO2 98 %.  Labs: Lab Results  Component Value Date   WBC 7.7 12/11/2013   HGB 16.2 12/11/2013   HCT 47.6 12/11/2013   MCV 87.5 12/11/2013   PLT 107* 12/11/2013     Recent Labs Lab 12/11/13 0616  NA 140  K 4.1  CL 100  CO2 24  BUN 20  CREATININE 0.78  CALCIUM 9.1  GLUCOSE 193*    Recent Labs  12/11/13 0616 12/11/13 1135 12/11/13 1800  TROPONINI 0.84* 0.93* 0.78*    Lab Results  Component Value Date   CHOL 146 07/02/2013   HDL 36* 07/02/2013   LDLCALC 49 07/02/2013   TRIG 305* 07/02/2013     Diagnostic Studies/Procedures   2D ECHO Study Date: 12/12/2013 LV EF: 60% -  65% Study Conclusions - Left ventricle: The cavity size was normal. Wall thickness was increased in a pattern of moderate LVH. Systolic function was normal. The estimated ejection fraction was in the range of 60% to 65%. Wall motion was normal; there were no regional wall motion abnormalities. The study is not technically sufficient to allow evaluation of LV diastolic function. - Aortic valve: Trileaflet. Sclerosis without stenosis. There was no regurgitation. - Mitral valve: Calcified annulus. There was trivial regurgitation. - Left atrium: The atrium was normal in size. - Right atrium: Moderately dilated 26 cm2. - Tricuspid valve: There was moderate regurgitation. - Pulmonary arteries: PA peak pressure: 46 mm Hg (S). Impressions: - Compared to the prior echo in 06/2013, the EF has improved to 60-65%. There is moderate TR with RVSP of 46 mmHg, dilated IVC.  Discharge Medications     Medication List    TAKE these  medications        acidophilus Caps capsule  Take 1 capsule by mouth 2 (two) times daily.     aspirin EC 81 MG tablet  Take 162 mg by mouth 2 (two) times daily.     beta carotene w/minerals tablet  Take 1 tablet by mouth daily.     carvedilol 25 MG tablet  Commonly known as:  COREG  Take 25 mg by mouth 2 (two) times daily with a meal.     cloNIDine 0.3 MG tablet  Commonly known as:  CATAPRES  Take 1 tablet (0.3 mg total) by mouth at bedtime.     Cyanocobalamin 1000 MCG/ML Liqd  Take 1 mL by mouth daily.     diltiazem 240 MG 24 hr capsule  Commonly known as:  CARDIZEM CD  Take 1 capsule (240 mg total) by mouth daily.     glyBURIDE-metformin 5-500 MG per tablet  Commonly known as:  GLUCOVANCE  Take 1 tablet by mouth 2 (two) times daily with a meal.     hydrALAZINE 10 MG tablet  Commonly known as:  APRESOLINE  Take 1 tablet (10 mg total) by mouth every 8 (eight) hours.     hydrochlorothiazide 25 MG tablet  Commonly known as:  HYDRODIURIL  Take  25 mg by mouth daily.     lisinopril 20 MG tablet  Commonly known as:  PRINIVIL,ZESTRIL  Take 1 tablet (20 mg total) by mouth daily.     magnesium oxide 400 (241.3 MG) MG tablet  Commonly known as:  MAG-OX  Take 1 tablet (400 mg total) by mouth 2 (two) times daily.     pravastatin 40 MG tablet  Commonly known as:  PRAVACHOL  Take 40 mg by mouth daily.     rivaroxaban 20 MG Tabs tablet  Commonly known as:  XARELTO  Take 1 tablet (20 mg total) by mouth daily with supper.        Disposition   The patient will be discharged in stable condition to home.  Follow-up Information    Follow up with HAGER, BRYAN, PA-C On 12/24/2013.   Specialty:  Physician Assistant   Why:  4 pm   Contact information:   [place] [address] [phone]         Duration of Discharge Encounter: Greater than 30 minutes including physician and PA time.  SignedGrandville Silos, Zita Ozimek R PA-C 12/12/2013, 1:21  PM

## 2013-12-12 NOTE — Plan of Care (Signed)
Problem: Consults Goal: General Medical Patient Education See Patient Education Module for specific education.  Outcome: Completed/Met Date Met:  12/12/13 Goal: Diabetes Guidelines if Diabetic/Glucose > 140 If diabetic or lab glucose is > 140 mg/dl - Initiate Diabetes/Hyperglycemia Guidelines & Document Interventions  Outcome: Completed/Met Date Met:  12/12/13  Problem: Phase I Progression Outcomes Goal: Initial discharge plan identified Outcome: Completed/Met Date Met:  12/12/13 Goal: Other Phase I Outcomes/Goals Outcome: Completed/Met Date Met:  12/12/13  Problem: Phase II Progression Outcomes Goal: Obtain order to discontinue catheter if appropriate Outcome: Not Applicable Date Met:  52/47/99

## 2013-12-12 NOTE — Progress Notes (Addendum)
Patient Name: Samuel Mccoy Date of Encounter: 12/12/2013     Active Problems:   Elevated troponin    SUBJECTIVE  Wants to go home. Feels fine. Anxious   CURRENT MEDS . aspirin EC  81 mg Oral Daily  . carvedilol  25 mg Oral BID WC  . cloNIDine  0.3 mg Oral Daily  . hydrALAZINE  10 mg Oral 3 times per day  . hydrochlorothiazide  25 mg Oral Daily  . insulin aspart  0-5 Units Subcutaneous QHS  . insulin aspart  0-9 Units Subcutaneous TID WC  . lisinopril  20 mg Oral Daily  . pravastatin  40 mg Oral q1800  . rivaroxaban  20 mg Oral Q supper    OBJECTIVE  Filed Vitals:   12/11/13 1649 12/11/13 2017 12/12/13 0051 12/12/13 0428  BP: 143/77 142/76 140/72 143/85  Pulse: 83 84 85 87  Temp: 98 F (36.7 C) 98 F (36.7 C) 98.3 F (36.8 C) 97.6 F (36.4 C)  TempSrc: Oral Oral Oral Oral  Resp: 20  20 20   Height:      Weight:    270 lb 8 oz (122.698 kg)  SpO2: 98% 98% 99% 98%    Intake/Output Summary (Last 24 hours) at 12/12/13 0850 Last data filed at 12/12/13 0600  Gross per 24 hour  Intake 201.08 ml  Output      0 ml  Net 201.08 ml   Filed Weights   12/11/13 0000 12/12/13 0428  Weight: 271 lb 11.2 oz (123.242 kg) 270 lb 8 oz (122.698 kg)    PHYSICAL EXAM  General: morbidly obese, in no acute distress. Head: Normocephalic, atraumatic, sclera non-icteric, oropharynx is clear Neck: Negative for carotid bruits. JVD not elevated. No adenopathy Lungs: Clear bilaterally to auscultation without wheezes, rales, or rhonchi. Breathing is unlabored. Heart: tachycardic, S1 S2 without murmurs, rubs, or gallops.  Abdomen: Soft, non-tender, non-distended with normoactive bowel sounds. No hepatomegaly. No rebound/guarding. No obvious abdominal masses. Msk: Strength and tone appears normal for age. Extremities: 1+ edema with chronic stasis skin changes.  Neuro: Alert and oriented X 3. Moves all extremities spontaneously. Psych: Responds to questions appropriately with  a normal affect.  Accessory Clinical Findings  CBC  Recent Labs  12/11/13 0616  WBC 7.7  HGB 16.2  HCT 47.6  MCV 87.5  PLT 737*   Basic Metabolic Panel  Recent Labs  12/11/13 0616  NA 140  K 4.1  CL 100  CO2 24  GLUCOSE 193*  BUN 20  CREATININE 0.78  CALCIUM 9.1    Cardiac Enzymes  Recent Labs  12/11/13 0616 12/11/13 1135 12/11/13 1800  TROPONINI 0.84* 0.93* 0.78*    TELE  NSR  Radiology/Studies  No results found.  ASSESSMENT AND PLAN  Samuel Mccoy is a 63 y.o. male with a history of CAD s/p remote PCI (90s), atrial flutter, paranoid type delusional disorder, and hypertension who was transferred from to [facility] from [facility] on 12/11/13 for atrial flutter with RVR and minimally elevated troponin.   Elevated cardiac biomarkers: Patient had minimal troponin elevation to 0.1 at outside hospital in the setting of atrial flutter with rapid ventricular response, severe LVH, possible coronary artery disease.  -- Troponin flat. 0.84-> 0.93-->0.78 -- Probably demand ischemia in setting of atrial flutter with RVR. Low risk stress test in June. No angina.  -- Continue cardiac meds   -- 2D ECHO pending. Done but not read.   Atrial flutter with RVR (Cavoisthmus-dependent atrial flutter):  Patient was offered ablation and/or cardioversion in June 2015 during his initial diagnosis; however, patient continues to refuse that even at present. He would rather continue medications and systemic anticoagulants. -- Restart carvedilol 25 mg by mouth twice a day, clonidine 0.3 by mouth daily -- Currently patient on IV dilt at 5 with HR in low 100s. Will give 240mg  dilt CD now and discontinue gtt in 1 hour and see how his HR responds. If he continues to be rate controlled, he may be able to go home later today.  -- Continue Xarelto 20 mg daily -- 2D ECHO pending. Done but not read.   CAD status post PCI: Patient reportedly had a percutaneous coronary intervention in  1990s. Currently asymptomatic -- Elevated trop thought to be demand ischemia in setting of atrial flutter with RVR. Low risk stress test in June. No angina. -- Continue aspirin 81 mg, carvedilol 25 mg BID and pravastatin 40 mg daily.     HTN: With evidence of enzymes organ damage with severe LVH noted on the echocardiogram in June. -- Poorly controlled on carvedilol 25mg  BID, clonidine 0.3mg , hctz, lisinopril 20mg  and diltiazem. Hydralazine 10mg  TID added to his home regimen now with better control. -- Convert dilt gtt to dilt CD 240mg .  HLD: Recommended high intensity statin; however, patient is reluctant to change any medications at this time. Continue pravastatin for now.  Samuel Pimple PA-C  Pager [phone]   The patient was seen, examined and discussed with Samuel Harp, PA-C and I agree with the above.   64 year old male admitted with mild troponin elevation sec to atrial flutter with variable block. Rate controlled with iv Cardizem 5 mg/hr. Switched to Cardizem 240 mg PO CD daily this am, still rate controlled. Asymptomatic, insisting on going home. Add home dose carvedilol 25 mg PO BID. He is refusing to have ablation done. We will discharge and arrange for an outpatient follow up.   Samuel Mccoy 12/12/2013

## 2013-12-15 NOTE — Telephone Encounter (Signed)
Route to [employer]

## 2013-12-24 ENCOUNTER — Ambulatory Visit: Payer: Medicare Other | Admitting: Physician Assistant

## 2014-01-12 ENCOUNTER — Ambulatory Visit: Payer: Medicare Other | Admitting: Cardiovascular Disease

## 2014-11-16 ENCOUNTER — Other Ambulatory Visit: Payer: Self-pay | Admitting: Physician Assistant

## 2015-07-09 DIAGNOSIS — I4891 Unspecified atrial fibrillation: Secondary | ICD-10-CM

## 2015-07-09 DIAGNOSIS — I10 Essential (primary) hypertension: Secondary | ICD-10-CM

## 2015-07-09 DIAGNOSIS — Z789 Other specified health status: Secondary | ICD-10-CM

## 2015-07-09 DIAGNOSIS — I739 Peripheral vascular disease, unspecified: Secondary | ICD-10-CM

## 2015-07-09 DIAGNOSIS — E860 Dehydration: Secondary | ICD-10-CM

## 2015-07-09 DIAGNOSIS — E11621 Type 2 diabetes mellitus with foot ulcer: Secondary | ICD-10-CM

## 2015-07-09 DIAGNOSIS — L03115 Cellulitis of right lower limb: Secondary | ICD-10-CM

## 2015-07-09 DIAGNOSIS — E1165 Type 2 diabetes mellitus with hyperglycemia: Secondary | ICD-10-CM | POA: Diagnosis not present

## 2015-07-10 DIAGNOSIS — I119 Hypertensive heart disease without heart failure: Secondary | ICD-10-CM

## 2015-07-10 DIAGNOSIS — E785 Hyperlipidemia, unspecified: Secondary | ICD-10-CM

## 2015-07-10 DIAGNOSIS — L03115 Cellulitis of right lower limb: Secondary | ICD-10-CM | POA: Diagnosis not present

## 2015-07-10 DIAGNOSIS — I2510 Atherosclerotic heart disease of native coronary artery without angina pectoris: Secondary | ICD-10-CM

## 2015-07-10 DIAGNOSIS — E1165 Type 2 diabetes mellitus with hyperglycemia: Secondary | ICD-10-CM

## 2015-07-10 DIAGNOSIS — F259 Schizoaffective disorder, unspecified: Secondary | ICD-10-CM

## 2015-07-10 DIAGNOSIS — K219 Gastro-esophageal reflux disease without esophagitis: Secondary | ICD-10-CM

## 2015-07-10 DIAGNOSIS — I739 Peripheral vascular disease, unspecified: Secondary | ICD-10-CM

## 2015-07-10 DIAGNOSIS — Z789 Other specified health status: Secondary | ICD-10-CM | POA: Diagnosis not present

## 2015-07-10 DIAGNOSIS — I4891 Unspecified atrial fibrillation: Secondary | ICD-10-CM

## 2015-07-10 DIAGNOSIS — E781 Pure hyperglyceridemia: Secondary | ICD-10-CM

## 2015-07-10 DIAGNOSIS — E11621 Type 2 diabetes mellitus with foot ulcer: Secondary | ICD-10-CM | POA: Diagnosis not present

## 2015-07-10 DIAGNOSIS — Z9119 Patient's noncompliance with other medical treatment and regimen: Secondary | ICD-10-CM

## 2015-07-11 DIAGNOSIS — E11621 Type 2 diabetes mellitus with foot ulcer: Secondary | ICD-10-CM | POA: Diagnosis not present

## 2015-07-11 DIAGNOSIS — L03115 Cellulitis of right lower limb: Secondary | ICD-10-CM | POA: Diagnosis not present

## 2015-07-11 DIAGNOSIS — Z789 Other specified health status: Secondary | ICD-10-CM | POA: Diagnosis not present

## 2015-07-11 DIAGNOSIS — I739 Peripheral vascular disease, unspecified: Secondary | ICD-10-CM | POA: Diagnosis not present

## 2015-08-18 DIAGNOSIS — E1165 Type 2 diabetes mellitus with hyperglycemia: Secondary | ICD-10-CM | POA: Diagnosis not present

## 2015-09-29 DIAGNOSIS — E1151 Type 2 diabetes mellitus with diabetic peripheral angiopathy without gangrene: Secondary | ICD-10-CM | POA: Diagnosis not present

## 2015-09-29 DIAGNOSIS — E1149 Type 2 diabetes mellitus with other diabetic neurological complication: Secondary | ICD-10-CM | POA: Diagnosis not present

## 2015-09-29 DIAGNOSIS — I739 Peripheral vascular disease, unspecified: Secondary | ICD-10-CM | POA: Diagnosis not present

## 2015-09-29 DIAGNOSIS — E11621 Type 2 diabetes mellitus with foot ulcer: Secondary | ICD-10-CM | POA: Diagnosis not present

## 2015-09-29 DIAGNOSIS — E1169 Type 2 diabetes mellitus with other specified complication: Secondary | ICD-10-CM | POA: Diagnosis not present

## 2015-10-08 DIAGNOSIS — E876 Hypokalemia: Secondary | ICD-10-CM | POA: Diagnosis not present

## 2015-10-08 DIAGNOSIS — Z23 Encounter for immunization: Secondary | ICD-10-CM | POA: Diagnosis not present

## 2016-09-28 DIAGNOSIS — R0602 Shortness of breath: Secondary | ICD-10-CM | POA: Diagnosis not present

## 2016-09-28 DIAGNOSIS — R404 Transient alteration of awareness: Secondary | ICD-10-CM | POA: Diagnosis not present

## 2016-09-28 DIAGNOSIS — R531 Weakness: Secondary | ICD-10-CM | POA: Diagnosis not present

## 2016-09-28 DIAGNOSIS — E119 Type 2 diabetes mellitus without complications: Secondary | ICD-10-CM | POA: Diagnosis not present

## 2016-09-28 DIAGNOSIS — E1165 Type 2 diabetes mellitus with hyperglycemia: Secondary | ICD-10-CM | POA: Diagnosis not present

## 2016-09-28 DIAGNOSIS — R55 Syncope and collapse: Secondary | ICD-10-CM | POA: Diagnosis not present

## 2016-11-30 DIAGNOSIS — E11621 Type 2 diabetes mellitus with foot ulcer: Secondary | ICD-10-CM | POA: Diagnosis not present

## 2016-12-06 DIAGNOSIS — E1149 Type 2 diabetes mellitus with other diabetic neurological complication: Secondary | ICD-10-CM | POA: Diagnosis not present

## 2017-01-17 DIAGNOSIS — D72829 Elevated white blood cell count, unspecified: Secondary | ICD-10-CM | POA: Diagnosis not present

## 2017-01-17 DIAGNOSIS — J9811 Atelectasis: Secondary | ICD-10-CM | POA: Diagnosis not present

## 2017-01-17 DIAGNOSIS — M7989 Other specified soft tissue disorders: Secondary | ICD-10-CM | POA: Diagnosis not present

## 2017-01-17 DIAGNOSIS — M86172 Other acute osteomyelitis, left ankle and foot: Secondary | ICD-10-CM | POA: Diagnosis not present

## 2017-01-17 DIAGNOSIS — E1165 Type 2 diabetes mellitus with hyperglycemia: Secondary | ICD-10-CM | POA: Diagnosis not present

## 2017-01-18 DIAGNOSIS — E1151 Type 2 diabetes mellitus with diabetic peripheral angiopathy without gangrene: Secondary | ICD-10-CM | POA: Diagnosis not present

## 2017-01-18 DIAGNOSIS — E1165 Type 2 diabetes mellitus with hyperglycemia: Secondary | ICD-10-CM | POA: Diagnosis not present

## 2017-01-18 DIAGNOSIS — M1990 Unspecified osteoarthritis, unspecified site: Secondary | ICD-10-CM | POA: Diagnosis not present

## 2017-01-18 DIAGNOSIS — Z9114 Patient's other noncompliance with medication regimen: Secondary | ICD-10-CM | POA: Diagnosis not present

## 2017-01-18 DIAGNOSIS — K219 Gastro-esophageal reflux disease without esophagitis: Secondary | ICD-10-CM | POA: Diagnosis not present

## 2017-01-18 DIAGNOSIS — J9811 Atelectasis: Secondary | ICD-10-CM | POA: Diagnosis not present

## 2017-01-18 DIAGNOSIS — M7989 Other specified soft tissue disorders: Secondary | ICD-10-CM | POA: Diagnosis not present

## 2017-01-18 DIAGNOSIS — A419 Sepsis, unspecified organism: Secondary | ICD-10-CM | POA: Diagnosis not present

## 2017-01-18 DIAGNOSIS — M86171 Other acute osteomyelitis, right ankle and foot: Secondary | ICD-10-CM | POA: Diagnosis not present

## 2017-01-18 DIAGNOSIS — Z79899 Other long term (current) drug therapy: Secondary | ICD-10-CM | POA: Diagnosis not present

## 2017-01-18 DIAGNOSIS — I10 Essential (primary) hypertension: Secondary | ICD-10-CM | POA: Diagnosis not present

## 2017-01-18 DIAGNOSIS — D72829 Elevated white blood cell count, unspecified: Secondary | ICD-10-CM | POA: Diagnosis not present

## 2017-01-18 DIAGNOSIS — E7800 Pure hypercholesterolemia, unspecified: Secondary | ICD-10-CM | POA: Diagnosis not present

## 2017-01-18 DIAGNOSIS — M869 Osteomyelitis, unspecified: Secondary | ICD-10-CM | POA: Diagnosis not present

## 2017-01-18 DIAGNOSIS — M86172 Other acute osteomyelitis, left ankle and foot: Secondary | ICD-10-CM | POA: Diagnosis not present

## 2017-01-18 DIAGNOSIS — M86671 Other chronic osteomyelitis, right ankle and foot: Secondary | ICD-10-CM | POA: Diagnosis not present

## 2017-01-18 DIAGNOSIS — R739 Hyperglycemia, unspecified: Secondary | ICD-10-CM | POA: Diagnosis not present

## 2017-01-18 DIAGNOSIS — L97519 Non-pressure chronic ulcer of other part of right foot with unspecified severity: Secondary | ICD-10-CM | POA: Diagnosis not present

## 2017-01-18 DIAGNOSIS — I4891 Unspecified atrial fibrillation: Secondary | ICD-10-CM | POA: Diagnosis not present

## 2017-01-18 DIAGNOSIS — E785 Hyperlipidemia, unspecified: Secondary | ICD-10-CM | POA: Diagnosis not present

## 2017-01-18 DIAGNOSIS — E1110 Type 2 diabetes mellitus with ketoacidosis without coma: Secondary | ICD-10-CM | POA: Diagnosis not present

## 2017-01-18 DIAGNOSIS — Z794 Long term (current) use of insulin: Secondary | ICD-10-CM | POA: Diagnosis not present

## 2017-01-18 DIAGNOSIS — E11621 Type 2 diabetes mellitus with foot ulcer: Secondary | ICD-10-CM | POA: Diagnosis not present

## 2017-01-18 DIAGNOSIS — E871 Hypo-osmolality and hyponatremia: Secondary | ICD-10-CM | POA: Diagnosis not present

## 2017-01-19 DIAGNOSIS — R739 Hyperglycemia, unspecified: Secondary | ICD-10-CM | POA: Diagnosis not present

## 2017-01-19 DIAGNOSIS — M869 Osteomyelitis, unspecified: Secondary | ICD-10-CM | POA: Diagnosis not present

## 2017-01-23 DIAGNOSIS — E1165 Type 2 diabetes mellitus with hyperglycemia: Secondary | ICD-10-CM | POA: Diagnosis not present

## 2017-01-24 DIAGNOSIS — L97516 Non-pressure chronic ulcer of other part of right foot with bone involvement without evidence of necrosis: Secondary | ICD-10-CM | POA: Diagnosis not present

## 2017-01-24 DIAGNOSIS — E1051 Type 1 diabetes mellitus with diabetic peripheral angiopathy without gangrene: Secondary | ICD-10-CM | POA: Diagnosis not present

## 2017-01-24 DIAGNOSIS — E10621 Type 1 diabetes mellitus with foot ulcer: Secondary | ICD-10-CM | POA: Diagnosis not present

## 2017-01-24 DIAGNOSIS — M8668 Other chronic osteomyelitis, other site: Secondary | ICD-10-CM | POA: Diagnosis not present

## 2017-01-24 DIAGNOSIS — I2510 Atherosclerotic heart disease of native coronary artery without angina pectoris: Secondary | ICD-10-CM | POA: Diagnosis not present

## 2017-01-24 DIAGNOSIS — I10 Essential (primary) hypertension: Secondary | ICD-10-CM | POA: Diagnosis not present

## 2017-01-24 DIAGNOSIS — Z794 Long term (current) use of insulin: Secondary | ICD-10-CM | POA: Diagnosis not present

## 2017-01-24 DIAGNOSIS — L97416 Non-pressure chronic ulcer of right heel and midfoot with bone involvement without evidence of necrosis: Secondary | ICD-10-CM | POA: Diagnosis not present

## 2017-01-24 DIAGNOSIS — M1990 Unspecified osteoarthritis, unspecified site: Secondary | ICD-10-CM | POA: Diagnosis not present

## 2017-01-24 DIAGNOSIS — E11621 Type 2 diabetes mellitus with foot ulcer: Secondary | ICD-10-CM | POA: Diagnosis not present

## 2017-01-24 DIAGNOSIS — M86671 Other chronic osteomyelitis, right ankle and foot: Secondary | ICD-10-CM | POA: Diagnosis not present

## 2017-01-27 DIAGNOSIS — I2510 Atherosclerotic heart disease of native coronary artery without angina pectoris: Secondary | ICD-10-CM | POA: Diagnosis not present

## 2017-01-27 DIAGNOSIS — M1991 Primary osteoarthritis, unspecified site: Secondary | ICD-10-CM | POA: Diagnosis not present

## 2017-01-27 DIAGNOSIS — L97416 Non-pressure chronic ulcer of right heel and midfoot with bone involvement without evidence of necrosis: Secondary | ICD-10-CM | POA: Diagnosis not present

## 2017-01-27 DIAGNOSIS — K219 Gastro-esophageal reflux disease without esophagitis: Secondary | ICD-10-CM | POA: Diagnosis not present

## 2017-01-27 DIAGNOSIS — Z7982 Long term (current) use of aspirin: Secondary | ICD-10-CM | POA: Diagnosis not present

## 2017-01-27 DIAGNOSIS — E1151 Type 2 diabetes mellitus with diabetic peripheral angiopathy without gangrene: Secondary | ICD-10-CM | POA: Diagnosis not present

## 2017-01-27 DIAGNOSIS — I10 Essential (primary) hypertension: Secondary | ICD-10-CM | POA: Diagnosis not present

## 2017-01-27 DIAGNOSIS — E7800 Pure hypercholesterolemia, unspecified: Secondary | ICD-10-CM | POA: Diagnosis not present

## 2017-01-27 DIAGNOSIS — N289 Disorder of kidney and ureter, unspecified: Secondary | ICD-10-CM | POA: Diagnosis not present

## 2017-01-27 DIAGNOSIS — E11621 Type 2 diabetes mellitus with foot ulcer: Secondary | ICD-10-CM | POA: Diagnosis not present

## 2017-01-27 DIAGNOSIS — E1165 Type 2 diabetes mellitus with hyperglycemia: Secondary | ICD-10-CM | POA: Diagnosis not present

## 2017-01-27 DIAGNOSIS — Z7984 Long term (current) use of oral hypoglycemic drugs: Secondary | ICD-10-CM | POA: Diagnosis not present

## 2017-01-27 DIAGNOSIS — M86671 Other chronic osteomyelitis, right ankle and foot: Secondary | ICD-10-CM | POA: Diagnosis not present

## 2017-01-27 DIAGNOSIS — E1169 Type 2 diabetes mellitus with other specified complication: Secondary | ICD-10-CM | POA: Diagnosis not present

## 2017-01-27 DIAGNOSIS — I4891 Unspecified atrial fibrillation: Secondary | ICD-10-CM | POA: Diagnosis not present

## 2017-01-27 DIAGNOSIS — N400 Enlarged prostate without lower urinary tract symptoms: Secondary | ICD-10-CM | POA: Diagnosis not present

## 2017-01-31 DIAGNOSIS — Z8739 Personal history of other diseases of the musculoskeletal system and connective tissue: Secondary | ICD-10-CM | POA: Diagnosis not present

## 2017-01-31 DIAGNOSIS — E11621 Type 2 diabetes mellitus with foot ulcer: Secondary | ICD-10-CM | POA: Diagnosis not present

## 2017-01-31 DIAGNOSIS — H2513 Age-related nuclear cataract, bilateral: Secondary | ICD-10-CM | POA: Diagnosis not present

## 2017-01-31 DIAGNOSIS — L97512 Non-pressure chronic ulcer of other part of right foot with fat layer exposed: Secondary | ICD-10-CM | POA: Diagnosis not present

## 2017-01-31 DIAGNOSIS — L97412 Non-pressure chronic ulcer of right heel and midfoot with fat layer exposed: Secondary | ICD-10-CM | POA: Diagnosis not present

## 2017-02-02 DIAGNOSIS — E1169 Type 2 diabetes mellitus with other specified complication: Secondary | ICD-10-CM | POA: Diagnosis not present

## 2017-02-02 DIAGNOSIS — K219 Gastro-esophageal reflux disease without esophagitis: Secondary | ICD-10-CM | POA: Diagnosis not present

## 2017-02-02 DIAGNOSIS — I10 Essential (primary) hypertension: Secondary | ICD-10-CM | POA: Diagnosis not present

## 2017-02-02 DIAGNOSIS — E11621 Type 2 diabetes mellitus with foot ulcer: Secondary | ICD-10-CM | POA: Diagnosis not present

## 2017-02-02 DIAGNOSIS — E1151 Type 2 diabetes mellitus with diabetic peripheral angiopathy without gangrene: Secondary | ICD-10-CM | POA: Diagnosis not present

## 2017-02-02 DIAGNOSIS — Z7982 Long term (current) use of aspirin: Secondary | ICD-10-CM | POA: Diagnosis not present

## 2017-02-02 DIAGNOSIS — I2510 Atherosclerotic heart disease of native coronary artery without angina pectoris: Secondary | ICD-10-CM | POA: Diagnosis not present

## 2017-02-02 DIAGNOSIS — I4891 Unspecified atrial fibrillation: Secondary | ICD-10-CM | POA: Diagnosis not present

## 2017-02-02 DIAGNOSIS — N289 Disorder of kidney and ureter, unspecified: Secondary | ICD-10-CM | POA: Diagnosis not present

## 2017-02-02 DIAGNOSIS — E1165 Type 2 diabetes mellitus with hyperglycemia: Secondary | ICD-10-CM | POA: Diagnosis not present

## 2017-02-02 DIAGNOSIS — M1991 Primary osteoarthritis, unspecified site: Secondary | ICD-10-CM | POA: Diagnosis not present

## 2017-02-02 DIAGNOSIS — N400 Enlarged prostate without lower urinary tract symptoms: Secondary | ICD-10-CM | POA: Diagnosis not present

## 2017-02-02 DIAGNOSIS — Z7984 Long term (current) use of oral hypoglycemic drugs: Secondary | ICD-10-CM | POA: Diagnosis not present

## 2017-02-02 DIAGNOSIS — M86671 Other chronic osteomyelitis, right ankle and foot: Secondary | ICD-10-CM | POA: Diagnosis not present

## 2017-02-02 DIAGNOSIS — E7800 Pure hypercholesterolemia, unspecified: Secondary | ICD-10-CM | POA: Diagnosis not present

## 2017-02-02 DIAGNOSIS — L97416 Non-pressure chronic ulcer of right heel and midfoot with bone involvement without evidence of necrosis: Secondary | ICD-10-CM | POA: Diagnosis not present

## 2017-02-06 DIAGNOSIS — I10 Essential (primary) hypertension: Secondary | ICD-10-CM | POA: Diagnosis not present

## 2017-02-06 DIAGNOSIS — R269 Unspecified abnormalities of gait and mobility: Secondary | ICD-10-CM | POA: Diagnosis not present

## 2017-02-06 DIAGNOSIS — L97511 Non-pressure chronic ulcer of other part of right foot limited to breakdown of skin: Secondary | ICD-10-CM | POA: Diagnosis not present

## 2017-02-06 DIAGNOSIS — E11621 Type 2 diabetes mellitus with foot ulcer: Secondary | ICD-10-CM | POA: Diagnosis not present

## 2017-02-07 DIAGNOSIS — M86671 Other chronic osteomyelitis, right ankle and foot: Secondary | ICD-10-CM | POA: Diagnosis not present

## 2017-02-07 DIAGNOSIS — L97512 Non-pressure chronic ulcer of other part of right foot with fat layer exposed: Secondary | ICD-10-CM | POA: Diagnosis not present

## 2017-02-07 DIAGNOSIS — E11621 Type 2 diabetes mellitus with foot ulcer: Secondary | ICD-10-CM | POA: Diagnosis not present

## 2017-02-07 DIAGNOSIS — L97412 Non-pressure chronic ulcer of right heel and midfoot with fat layer exposed: Secondary | ICD-10-CM | POA: Diagnosis not present

## 2017-02-09 DIAGNOSIS — E11621 Type 2 diabetes mellitus with foot ulcer: Secondary | ICD-10-CM | POA: Diagnosis not present

## 2017-02-09 DIAGNOSIS — R269 Unspecified abnormalities of gait and mobility: Secondary | ICD-10-CM | POA: Diagnosis not present

## 2017-02-09 DIAGNOSIS — I10 Essential (primary) hypertension: Secondary | ICD-10-CM | POA: Diagnosis not present

## 2017-02-09 DIAGNOSIS — L97511 Non-pressure chronic ulcer of other part of right foot limited to breakdown of skin: Secondary | ICD-10-CM | POA: Diagnosis not present

## 2017-02-12 DIAGNOSIS — R269 Unspecified abnormalities of gait and mobility: Secondary | ICD-10-CM | POA: Diagnosis not present

## 2017-02-12 DIAGNOSIS — L97516 Non-pressure chronic ulcer of other part of right foot with bone involvement without evidence of necrosis: Secondary | ICD-10-CM | POA: Diagnosis not present

## 2017-02-12 DIAGNOSIS — I10 Essential (primary) hypertension: Secondary | ICD-10-CM | POA: Diagnosis not present

## 2017-02-12 DIAGNOSIS — E11621 Type 2 diabetes mellitus with foot ulcer: Secondary | ICD-10-CM | POA: Diagnosis not present

## 2017-02-12 DIAGNOSIS — L97511 Non-pressure chronic ulcer of other part of right foot limited to breakdown of skin: Secondary | ICD-10-CM | POA: Diagnosis not present

## 2017-02-14 DIAGNOSIS — L97512 Non-pressure chronic ulcer of other part of right foot with fat layer exposed: Secondary | ICD-10-CM | POA: Diagnosis not present

## 2017-02-14 DIAGNOSIS — E11621 Type 2 diabetes mellitus with foot ulcer: Secondary | ICD-10-CM | POA: Diagnosis not present

## 2017-02-14 DIAGNOSIS — L97412 Non-pressure chronic ulcer of right heel and midfoot with fat layer exposed: Secondary | ICD-10-CM | POA: Diagnosis not present

## 2017-02-14 DIAGNOSIS — M86671 Other chronic osteomyelitis, right ankle and foot: Secondary | ICD-10-CM | POA: Diagnosis not present

## 2017-02-15 DIAGNOSIS — E11621 Type 2 diabetes mellitus with foot ulcer: Secondary | ICD-10-CM | POA: Diagnosis not present

## 2017-02-15 DIAGNOSIS — R269 Unspecified abnormalities of gait and mobility: Secondary | ICD-10-CM | POA: Diagnosis not present

## 2017-02-15 DIAGNOSIS — L97511 Non-pressure chronic ulcer of other part of right foot limited to breakdown of skin: Secondary | ICD-10-CM | POA: Diagnosis not present

## 2017-02-15 DIAGNOSIS — I10 Essential (primary) hypertension: Secondary | ICD-10-CM | POA: Diagnosis not present

## 2017-02-16 DIAGNOSIS — I10 Essential (primary) hypertension: Secondary | ICD-10-CM | POA: Diagnosis not present

## 2017-02-16 DIAGNOSIS — E11621 Type 2 diabetes mellitus with foot ulcer: Secondary | ICD-10-CM | POA: Diagnosis not present

## 2017-02-16 DIAGNOSIS — R269 Unspecified abnormalities of gait and mobility: Secondary | ICD-10-CM | POA: Diagnosis not present

## 2017-02-16 DIAGNOSIS — L97511 Non-pressure chronic ulcer of other part of right foot limited to breakdown of skin: Secondary | ICD-10-CM | POA: Diagnosis not present

## 2017-02-19 DIAGNOSIS — L97511 Non-pressure chronic ulcer of other part of right foot limited to breakdown of skin: Secondary | ICD-10-CM | POA: Diagnosis not present

## 2017-02-19 DIAGNOSIS — R269 Unspecified abnormalities of gait and mobility: Secondary | ICD-10-CM | POA: Diagnosis not present

## 2017-02-19 DIAGNOSIS — I10 Essential (primary) hypertension: Secondary | ICD-10-CM | POA: Diagnosis not present

## 2017-02-19 DIAGNOSIS — E11621 Type 2 diabetes mellitus with foot ulcer: Secondary | ICD-10-CM | POA: Diagnosis not present

## 2017-02-20 DIAGNOSIS — L97511 Non-pressure chronic ulcer of other part of right foot limited to breakdown of skin: Secondary | ICD-10-CM | POA: Diagnosis not present

## 2017-02-20 DIAGNOSIS — R269 Unspecified abnormalities of gait and mobility: Secondary | ICD-10-CM | POA: Diagnosis not present

## 2017-02-20 DIAGNOSIS — E11621 Type 2 diabetes mellitus with foot ulcer: Secondary | ICD-10-CM | POA: Diagnosis not present

## 2017-02-20 DIAGNOSIS — I10 Essential (primary) hypertension: Secondary | ICD-10-CM | POA: Diagnosis not present

## 2017-02-21 DIAGNOSIS — L97512 Non-pressure chronic ulcer of other part of right foot with fat layer exposed: Secondary | ICD-10-CM | POA: Diagnosis not present

## 2017-02-21 DIAGNOSIS — L97412 Non-pressure chronic ulcer of right heel and midfoot with fat layer exposed: Secondary | ICD-10-CM | POA: Diagnosis not present

## 2017-02-21 DIAGNOSIS — E11621 Type 2 diabetes mellitus with foot ulcer: Secondary | ICD-10-CM | POA: Diagnosis not present

## 2017-02-21 DIAGNOSIS — M86671 Other chronic osteomyelitis, right ankle and foot: Secondary | ICD-10-CM | POA: Diagnosis not present

## 2017-02-22 DIAGNOSIS — L97511 Non-pressure chronic ulcer of other part of right foot limited to breakdown of skin: Secondary | ICD-10-CM | POA: Diagnosis not present

## 2017-02-22 DIAGNOSIS — I10 Essential (primary) hypertension: Secondary | ICD-10-CM | POA: Diagnosis not present

## 2017-02-22 DIAGNOSIS — E11621 Type 2 diabetes mellitus with foot ulcer: Secondary | ICD-10-CM | POA: Diagnosis not present

## 2017-02-22 DIAGNOSIS — R269 Unspecified abnormalities of gait and mobility: Secondary | ICD-10-CM | POA: Diagnosis not present

## 2017-02-23 DIAGNOSIS — E782 Mixed hyperlipidemia: Secondary | ICD-10-CM | POA: Diagnosis not present

## 2017-02-23 DIAGNOSIS — I2510 Atherosclerotic heart disease of native coronary artery without angina pectoris: Secondary | ICD-10-CM | POA: Diagnosis not present

## 2017-02-23 DIAGNOSIS — E11621 Type 2 diabetes mellitus with foot ulcer: Secondary | ICD-10-CM | POA: Diagnosis not present

## 2017-02-23 DIAGNOSIS — L97511 Non-pressure chronic ulcer of other part of right foot limited to breakdown of skin: Secondary | ICD-10-CM | POA: Diagnosis not present

## 2017-02-23 DIAGNOSIS — E1149 Type 2 diabetes mellitus with other diabetic neurological complication: Secondary | ICD-10-CM | POA: Diagnosis not present

## 2017-02-23 DIAGNOSIS — E1142 Type 2 diabetes mellitus with diabetic polyneuropathy: Secondary | ICD-10-CM | POA: Diagnosis not present

## 2017-02-23 DIAGNOSIS — E1129 Type 2 diabetes mellitus with other diabetic kidney complication: Secondary | ICD-10-CM | POA: Diagnosis not present

## 2017-02-23 DIAGNOSIS — R269 Unspecified abnormalities of gait and mobility: Secondary | ICD-10-CM | POA: Diagnosis not present

## 2017-02-23 DIAGNOSIS — I10 Essential (primary) hypertension: Secondary | ICD-10-CM | POA: Diagnosis not present

## 2017-02-26 DIAGNOSIS — L97511 Non-pressure chronic ulcer of other part of right foot limited to breakdown of skin: Secondary | ICD-10-CM | POA: Diagnosis not present

## 2017-02-26 DIAGNOSIS — R269 Unspecified abnormalities of gait and mobility: Secondary | ICD-10-CM | POA: Diagnosis not present

## 2017-02-26 DIAGNOSIS — I10 Essential (primary) hypertension: Secondary | ICD-10-CM | POA: Diagnosis not present

## 2017-02-26 DIAGNOSIS — E11621 Type 2 diabetes mellitus with foot ulcer: Secondary | ICD-10-CM | POA: Diagnosis not present

## 2017-02-28 DIAGNOSIS — L97412 Non-pressure chronic ulcer of right heel and midfoot with fat layer exposed: Secondary | ICD-10-CM | POA: Diagnosis not present

## 2017-02-28 DIAGNOSIS — M86671 Other chronic osteomyelitis, right ankle and foot: Secondary | ICD-10-CM | POA: Diagnosis not present

## 2017-02-28 DIAGNOSIS — E11621 Type 2 diabetes mellitus with foot ulcer: Secondary | ICD-10-CM | POA: Diagnosis not present

## 2017-02-28 DIAGNOSIS — L97512 Non-pressure chronic ulcer of other part of right foot with fat layer exposed: Secondary | ICD-10-CM | POA: Diagnosis not present

## 2017-03-02 DIAGNOSIS — E11621 Type 2 diabetes mellitus with foot ulcer: Secondary | ICD-10-CM | POA: Diagnosis not present

## 2017-03-02 DIAGNOSIS — I10 Essential (primary) hypertension: Secondary | ICD-10-CM | POA: Diagnosis not present

## 2017-03-02 DIAGNOSIS — R269 Unspecified abnormalities of gait and mobility: Secondary | ICD-10-CM | POA: Diagnosis not present

## 2017-03-02 DIAGNOSIS — L97511 Non-pressure chronic ulcer of other part of right foot limited to breakdown of skin: Secondary | ICD-10-CM | POA: Diagnosis not present

## 2017-03-05 DIAGNOSIS — R269 Unspecified abnormalities of gait and mobility: Secondary | ICD-10-CM | POA: Diagnosis not present

## 2017-03-05 DIAGNOSIS — L97511 Non-pressure chronic ulcer of other part of right foot limited to breakdown of skin: Secondary | ICD-10-CM | POA: Diagnosis not present

## 2017-03-05 DIAGNOSIS — E11621 Type 2 diabetes mellitus with foot ulcer: Secondary | ICD-10-CM | POA: Diagnosis not present

## 2017-03-05 DIAGNOSIS — I10 Essential (primary) hypertension: Secondary | ICD-10-CM | POA: Diagnosis not present

## 2017-03-07 DIAGNOSIS — L97412 Non-pressure chronic ulcer of right heel and midfoot with fat layer exposed: Secondary | ICD-10-CM | POA: Diagnosis not present

## 2017-03-07 DIAGNOSIS — E11621 Type 2 diabetes mellitus with foot ulcer: Secondary | ICD-10-CM | POA: Diagnosis not present

## 2017-03-07 DIAGNOSIS — M86671 Other chronic osteomyelitis, right ankle and foot: Secondary | ICD-10-CM | POA: Diagnosis not present

## 2017-03-07 DIAGNOSIS — L97512 Non-pressure chronic ulcer of other part of right foot with fat layer exposed: Secondary | ICD-10-CM | POA: Diagnosis not present

## 2017-03-09 DIAGNOSIS — E11621 Type 2 diabetes mellitus with foot ulcer: Secondary | ICD-10-CM | POA: Diagnosis not present

## 2017-03-09 DIAGNOSIS — I10 Essential (primary) hypertension: Secondary | ICD-10-CM | POA: Diagnosis not present

## 2017-03-09 DIAGNOSIS — R269 Unspecified abnormalities of gait and mobility: Secondary | ICD-10-CM | POA: Diagnosis not present

## 2017-03-09 DIAGNOSIS — L97511 Non-pressure chronic ulcer of other part of right foot limited to breakdown of skin: Secondary | ICD-10-CM | POA: Diagnosis not present

## 2017-03-12 DIAGNOSIS — L97511 Non-pressure chronic ulcer of other part of right foot limited to breakdown of skin: Secondary | ICD-10-CM | POA: Diagnosis not present

## 2017-03-12 DIAGNOSIS — R269 Unspecified abnormalities of gait and mobility: Secondary | ICD-10-CM | POA: Diagnosis not present

## 2017-03-12 DIAGNOSIS — I10 Essential (primary) hypertension: Secondary | ICD-10-CM | POA: Diagnosis not present

## 2017-03-12 DIAGNOSIS — E11621 Type 2 diabetes mellitus with foot ulcer: Secondary | ICD-10-CM | POA: Diagnosis not present

## 2017-03-16 DIAGNOSIS — R269 Unspecified abnormalities of gait and mobility: Secondary | ICD-10-CM | POA: Diagnosis not present

## 2017-03-16 DIAGNOSIS — L97511 Non-pressure chronic ulcer of other part of right foot limited to breakdown of skin: Secondary | ICD-10-CM | POA: Diagnosis not present

## 2017-03-16 DIAGNOSIS — I10 Essential (primary) hypertension: Secondary | ICD-10-CM | POA: Diagnosis not present

## 2017-03-16 DIAGNOSIS — E11621 Type 2 diabetes mellitus with foot ulcer: Secondary | ICD-10-CM | POA: Diagnosis not present

## 2017-03-19 DIAGNOSIS — L97511 Non-pressure chronic ulcer of other part of right foot limited to breakdown of skin: Secondary | ICD-10-CM | POA: Diagnosis not present

## 2017-03-19 DIAGNOSIS — I10 Essential (primary) hypertension: Secondary | ICD-10-CM | POA: Diagnosis not present

## 2017-03-19 DIAGNOSIS — E11621 Type 2 diabetes mellitus with foot ulcer: Secondary | ICD-10-CM | POA: Diagnosis not present

## 2017-03-19 DIAGNOSIS — R269 Unspecified abnormalities of gait and mobility: Secondary | ICD-10-CM | POA: Diagnosis not present

## 2017-03-23 DIAGNOSIS — I10 Essential (primary) hypertension: Secondary | ICD-10-CM | POA: Diagnosis not present

## 2017-03-23 DIAGNOSIS — L97511 Non-pressure chronic ulcer of other part of right foot limited to breakdown of skin: Secondary | ICD-10-CM | POA: Diagnosis not present

## 2017-03-23 DIAGNOSIS — R269 Unspecified abnormalities of gait and mobility: Secondary | ICD-10-CM | POA: Diagnosis not present

## 2017-03-23 DIAGNOSIS — E11621 Type 2 diabetes mellitus with foot ulcer: Secondary | ICD-10-CM | POA: Diagnosis not present

## 2017-07-04 DIAGNOSIS — N183 Chronic kidney disease, stage 3 (moderate): Secondary | ICD-10-CM | POA: Diagnosis not present

## 2017-07-04 DIAGNOSIS — I10 Essential (primary) hypertension: Secondary | ICD-10-CM | POA: Diagnosis not present

## 2017-07-04 DIAGNOSIS — E1129 Type 2 diabetes mellitus with other diabetic kidney complication: Secondary | ICD-10-CM | POA: Diagnosis not present

## 2017-07-04 DIAGNOSIS — I2510 Atherosclerotic heart disease of native coronary artery without angina pectoris: Secondary | ICD-10-CM | POA: Diagnosis not present

## 2017-07-04 DIAGNOSIS — E782 Mixed hyperlipidemia: Secondary | ICD-10-CM | POA: Diagnosis not present

## 2017-07-04 DIAGNOSIS — Z23 Encounter for immunization: Secondary | ICD-10-CM | POA: Diagnosis not present

## 2017-10-19 DIAGNOSIS — L97511 Non-pressure chronic ulcer of other part of right foot limited to breakdown of skin: Secondary | ICD-10-CM | POA: Diagnosis not present

## 2017-10-19 DIAGNOSIS — A401 Sepsis due to streptococcus, group B: Secondary | ICD-10-CM | POA: Diagnosis not present

## 2017-10-19 DIAGNOSIS — A419 Sepsis, unspecified organism: Secondary | ICD-10-CM | POA: Diagnosis not present

## 2017-10-19 DIAGNOSIS — I482 Chronic atrial fibrillation, unspecified: Secondary | ICD-10-CM | POA: Diagnosis not present

## 2017-10-19 DIAGNOSIS — J986 Disorders of diaphragm: Secondary | ICD-10-CM | POA: Diagnosis not present

## 2017-10-19 DIAGNOSIS — E860 Dehydration: Secondary | ICD-10-CM | POA: Diagnosis not present

## 2017-10-19 DIAGNOSIS — Z79899 Other long term (current) drug therapy: Secondary | ICD-10-CM | POA: Diagnosis not present

## 2017-10-19 DIAGNOSIS — L03115 Cellulitis of right lower limb: Secondary | ICD-10-CM | POA: Diagnosis not present

## 2017-10-19 DIAGNOSIS — E11621 Type 2 diabetes mellitus with foot ulcer: Secondary | ICD-10-CM | POA: Diagnosis not present

## 2017-10-19 DIAGNOSIS — R4182 Altered mental status, unspecified: Secondary | ICD-10-CM | POA: Diagnosis not present

## 2017-10-19 DIAGNOSIS — Z7982 Long term (current) use of aspirin: Secondary | ICD-10-CM | POA: Diagnosis not present

## 2017-10-19 DIAGNOSIS — R531 Weakness: Secondary | ICD-10-CM | POA: Diagnosis not present

## 2017-10-19 DIAGNOSIS — N281 Cyst of kidney, acquired: Secondary | ICD-10-CM | POA: Diagnosis not present

## 2017-10-19 DIAGNOSIS — L97519 Non-pressure chronic ulcer of other part of right foot with unspecified severity: Secondary | ICD-10-CM | POA: Diagnosis not present

## 2017-10-19 DIAGNOSIS — R410 Disorientation, unspecified: Secondary | ICD-10-CM | POA: Diagnosis not present

## 2017-10-19 DIAGNOSIS — L0390 Cellulitis, unspecified: Secondary | ICD-10-CM | POA: Diagnosis not present

## 2017-10-19 DIAGNOSIS — E1151 Type 2 diabetes mellitus with diabetic peripheral angiopathy without gangrene: Secondary | ICD-10-CM | POA: Diagnosis not present

## 2017-10-19 DIAGNOSIS — Z9114 Patient's other noncompliance with medication regimen: Secondary | ICD-10-CM | POA: Diagnosis not present

## 2017-10-19 DIAGNOSIS — R7881 Bacteremia: Secondary | ICD-10-CM | POA: Diagnosis not present

## 2017-10-19 DIAGNOSIS — G9340 Encephalopathy, unspecified: Secondary | ICD-10-CM | POA: Diagnosis not present

## 2017-10-19 DIAGNOSIS — I119 Hypertensive heart disease without heart failure: Secondary | ICD-10-CM | POA: Diagnosis not present

## 2017-10-19 DIAGNOSIS — N179 Acute kidney failure, unspecified: Secondary | ICD-10-CM | POA: Diagnosis not present

## 2017-10-19 DIAGNOSIS — M7989 Other specified soft tissue disorders: Secondary | ICD-10-CM | POA: Diagnosis not present

## 2017-10-19 DIAGNOSIS — Z794 Long term (current) use of insulin: Secondary | ICD-10-CM | POA: Diagnosis not present

## 2017-10-19 DIAGNOSIS — E785 Hyperlipidemia, unspecified: Secondary | ICD-10-CM | POA: Diagnosis not present

## 2017-10-19 DIAGNOSIS — E7800 Pure hypercholesterolemia, unspecified: Secondary | ICD-10-CM | POA: Diagnosis not present

## 2017-10-19 DIAGNOSIS — Z743 Need for continuous supervision: Secondary | ICD-10-CM | POA: Diagnosis not present

## 2017-10-19 DIAGNOSIS — I4891 Unspecified atrial fibrillation: Secondary | ICD-10-CM | POA: Diagnosis not present

## 2017-10-19 DIAGNOSIS — K219 Gastro-esophageal reflux disease without esophagitis: Secondary | ICD-10-CM | POA: Diagnosis not present

## 2017-10-19 DIAGNOSIS — E871 Hypo-osmolality and hyponatremia: Secondary | ICD-10-CM | POA: Diagnosis not present

## 2017-10-19 DIAGNOSIS — M1990 Unspecified osteoarthritis, unspecified site: Secondary | ICD-10-CM | POA: Diagnosis not present

## 2017-10-19 DIAGNOSIS — G9341 Metabolic encephalopathy: Secondary | ICD-10-CM | POA: Diagnosis not present

## 2017-10-19 DIAGNOSIS — N170 Acute kidney failure with tubular necrosis: Secondary | ICD-10-CM | POA: Diagnosis not present

## 2017-10-19 DIAGNOSIS — E1165 Type 2 diabetes mellitus with hyperglycemia: Secondary | ICD-10-CM | POA: Diagnosis not present

## 2017-10-19 DIAGNOSIS — R739 Hyperglycemia, unspecified: Secondary | ICD-10-CM

## 2017-10-19 DIAGNOSIS — M79661 Pain in right lower leg: Secondary | ICD-10-CM | POA: Diagnosis not present

## 2017-10-19 DIAGNOSIS — R6521 Severe sepsis with septic shock: Secondary | ICD-10-CM | POA: Diagnosis not present

## 2017-10-19 DIAGNOSIS — R000 Tachycardia, unspecified: Secondary | ICD-10-CM | POA: Diagnosis not present

## 2017-10-19 DIAGNOSIS — F259 Schizoaffective disorder, unspecified: Secondary | ICD-10-CM | POA: Diagnosis not present

## 2017-10-19 DIAGNOSIS — I739 Peripheral vascular disease, unspecified: Secondary | ICD-10-CM

## 2017-10-19 DIAGNOSIS — I10 Essential (primary) hypertension: Secondary | ICD-10-CM | POA: Diagnosis not present

## 2017-10-19 DIAGNOSIS — Z23 Encounter for immunization: Secondary | ICD-10-CM | POA: Diagnosis not present

## 2017-10-20 DIAGNOSIS — E871 Hypo-osmolality and hyponatremia: Secondary | ICD-10-CM

## 2017-10-20 DIAGNOSIS — F259 Schizoaffective disorder, unspecified: Secondary | ICD-10-CM

## 2017-10-20 DIAGNOSIS — K219 Gastro-esophageal reflux disease without esophagitis: Secondary | ICD-10-CM

## 2017-10-20 DIAGNOSIS — I119 Hypertensive heart disease without heart failure: Secondary | ICD-10-CM

## 2017-10-20 DIAGNOSIS — R4182 Altered mental status, unspecified: Secondary | ICD-10-CM

## 2017-10-20 DIAGNOSIS — A419 Sepsis, unspecified organism: Secondary | ICD-10-CM

## 2017-10-23 DIAGNOSIS — N179 Acute kidney failure, unspecified: Secondary | ICD-10-CM

## 2017-10-23 DIAGNOSIS — N170 Acute kidney failure with tubular necrosis: Secondary | ICD-10-CM

## 2017-10-24 DIAGNOSIS — R7881 Bacteremia: Secondary | ICD-10-CM

## 2017-10-24 DIAGNOSIS — I482 Chronic atrial fibrillation, unspecified: Secondary | ICD-10-CM | POA: Diagnosis not present

## 2017-10-24 DIAGNOSIS — R6521 Severe sepsis with septic shock: Secondary | ICD-10-CM | POA: Diagnosis not present

## 2017-10-24 DIAGNOSIS — G9341 Metabolic encephalopathy: Secondary | ICD-10-CM | POA: Diagnosis not present

## 2017-10-24 DIAGNOSIS — Z7982 Long term (current) use of aspirin: Secondary | ICD-10-CM | POA: Diagnosis not present

## 2017-10-24 DIAGNOSIS — R531 Weakness: Secondary | ICD-10-CM | POA: Diagnosis not present

## 2017-10-24 DIAGNOSIS — Z23 Encounter for immunization: Secondary | ICD-10-CM | POA: Diagnosis not present

## 2017-10-24 DIAGNOSIS — Z79899 Other long term (current) drug therapy: Secondary | ICD-10-CM | POA: Diagnosis not present

## 2017-10-24 DIAGNOSIS — E7800 Pure hypercholesterolemia, unspecified: Secondary | ICD-10-CM | POA: Diagnosis not present

## 2017-10-24 DIAGNOSIS — E871 Hypo-osmolality and hyponatremia: Secondary | ICD-10-CM | POA: Diagnosis not present

## 2017-10-24 DIAGNOSIS — E860 Dehydration: Secondary | ICD-10-CM | POA: Diagnosis not present

## 2017-10-24 DIAGNOSIS — L03115 Cellulitis of right lower limb: Secondary | ICD-10-CM | POA: Diagnosis not present

## 2017-10-24 DIAGNOSIS — E1151 Type 2 diabetes mellitus with diabetic peripheral angiopathy without gangrene: Secondary | ICD-10-CM | POA: Diagnosis not present

## 2017-10-24 DIAGNOSIS — M1990 Unspecified osteoarthritis, unspecified site: Secondary | ICD-10-CM | POA: Diagnosis not present

## 2017-10-24 DIAGNOSIS — I119 Hypertensive heart disease without heart failure: Secondary | ICD-10-CM | POA: Diagnosis not present

## 2017-10-24 DIAGNOSIS — Z9114 Patient's other noncompliance with medication regimen: Secondary | ICD-10-CM | POA: Diagnosis not present

## 2017-10-24 DIAGNOSIS — L97519 Non-pressure chronic ulcer of other part of right foot with unspecified severity: Secondary | ICD-10-CM | POA: Diagnosis not present

## 2017-10-24 DIAGNOSIS — E11621 Type 2 diabetes mellitus with foot ulcer: Secondary | ICD-10-CM

## 2017-10-24 DIAGNOSIS — Z794 Long term (current) use of insulin: Secondary | ICD-10-CM | POA: Diagnosis not present

## 2017-10-24 DIAGNOSIS — A401 Sepsis due to streptococcus, group B: Secondary | ICD-10-CM | POA: Diagnosis not present

## 2017-10-24 DIAGNOSIS — E1165 Type 2 diabetes mellitus with hyperglycemia: Secondary | ICD-10-CM | POA: Diagnosis not present

## 2017-10-24 DIAGNOSIS — N170 Acute kidney failure with tubular necrosis: Secondary | ICD-10-CM | POA: Diagnosis not present

## 2017-10-24 DIAGNOSIS — K219 Gastro-esophageal reflux disease without esophagitis: Secondary | ICD-10-CM | POA: Diagnosis not present

## 2017-10-26 DIAGNOSIS — N179 Acute kidney failure, unspecified: Secondary | ICD-10-CM | POA: Diagnosis not present

## 2017-11-06 DIAGNOSIS — E11621 Type 2 diabetes mellitus with foot ulcer: Secondary | ICD-10-CM | POA: Diagnosis not present

## 2017-11-06 DIAGNOSIS — E782 Mixed hyperlipidemia: Secondary | ICD-10-CM | POA: Diagnosis not present

## 2017-11-06 DIAGNOSIS — N183 Chronic kidney disease, stage 3 (moderate): Secondary | ICD-10-CM | POA: Diagnosis not present

## 2017-11-06 DIAGNOSIS — I2510 Atherosclerotic heart disease of native coronary artery without angina pectoris: Secondary | ICD-10-CM | POA: Diagnosis not present

## 2017-11-06 DIAGNOSIS — E1129 Type 2 diabetes mellitus with other diabetic kidney complication: Secondary | ICD-10-CM | POA: Diagnosis not present

## 2017-11-06 DIAGNOSIS — I10 Essential (primary) hypertension: Secondary | ICD-10-CM | POA: Diagnosis not present

## 2017-11-13 DIAGNOSIS — L97811 Non-pressure chronic ulcer of other part of right lower leg limited to breakdown of skin: Secondary | ICD-10-CM | POA: Diagnosis not present

## 2017-11-13 DIAGNOSIS — D2271 Melanocytic nevi of right lower limb, including hip: Secondary | ICD-10-CM | POA: Diagnosis not present

## 2017-11-13 DIAGNOSIS — I10 Essential (primary) hypertension: Secondary | ICD-10-CM | POA: Diagnosis not present

## 2017-11-22 DIAGNOSIS — R233 Spontaneous ecchymoses: Secondary | ICD-10-CM | POA: Diagnosis not present

## 2017-11-22 DIAGNOSIS — D485 Neoplasm of uncertain behavior of skin: Secondary | ICD-10-CM | POA: Diagnosis not present

## 2017-12-12 DIAGNOSIS — I10 Essential (primary) hypertension: Secondary | ICD-10-CM | POA: Diagnosis not present

## 2017-12-26 DIAGNOSIS — H2513 Age-related nuclear cataract, bilateral: Secondary | ICD-10-CM | POA: Diagnosis not present

## 2018-01-02 ENCOUNTER — Encounter (INDEPENDENT_AMBULATORY_CARE_PROVIDER_SITE_OTHER): Payer: Medicare Other | Admitting: Ophthalmology

## 2018-01-02 DIAGNOSIS — H4312 Vitreous hemorrhage, left eye: Secondary | ICD-10-CM | POA: Diagnosis not present

## 2018-01-02 DIAGNOSIS — E113592 Type 2 diabetes mellitus with proliferative diabetic retinopathy without macular edema, left eye: Secondary | ICD-10-CM

## 2018-01-02 DIAGNOSIS — E113511 Type 2 diabetes mellitus with proliferative diabetic retinopathy with macular edema, right eye: Secondary | ICD-10-CM

## 2018-01-02 DIAGNOSIS — E11311 Type 2 diabetes mellitus with unspecified diabetic retinopathy with macular edema: Secondary | ICD-10-CM | POA: Diagnosis not present

## 2018-01-02 DIAGNOSIS — H43813 Vitreous degeneration, bilateral: Secondary | ICD-10-CM

## 2018-01-02 DIAGNOSIS — I10 Essential (primary) hypertension: Secondary | ICD-10-CM | POA: Diagnosis not present

## 2018-01-02 DIAGNOSIS — H35033 Hypertensive retinopathy, bilateral: Secondary | ICD-10-CM

## 2018-01-02 DIAGNOSIS — H2513 Age-related nuclear cataract, bilateral: Secondary | ICD-10-CM

## 2018-01-14 DIAGNOSIS — I10 Essential (primary) hypertension: Secondary | ICD-10-CM | POA: Diagnosis not present

## 2018-01-14 DIAGNOSIS — E1142 Type 2 diabetes mellitus with diabetic polyneuropathy: Secondary | ICD-10-CM | POA: Diagnosis not present

## 2018-01-30 ENCOUNTER — Encounter (INDEPENDENT_AMBULATORY_CARE_PROVIDER_SITE_OTHER): Payer: Medicare Other | Admitting: Ophthalmology

## 2018-01-30 DIAGNOSIS — E113513 Type 2 diabetes mellitus with proliferative diabetic retinopathy with macular edema, bilateral: Secondary | ICD-10-CM | POA: Diagnosis not present

## 2018-01-30 DIAGNOSIS — H2513 Age-related nuclear cataract, bilateral: Secondary | ICD-10-CM

## 2018-01-30 DIAGNOSIS — E11311 Type 2 diabetes mellitus with unspecified diabetic retinopathy with macular edema: Secondary | ICD-10-CM

## 2018-01-30 DIAGNOSIS — H4312 Vitreous hemorrhage, left eye: Secondary | ICD-10-CM | POA: Diagnosis not present

## 2018-01-30 DIAGNOSIS — I10 Essential (primary) hypertension: Secondary | ICD-10-CM | POA: Diagnosis not present

## 2018-01-30 DIAGNOSIS — H35033 Hypertensive retinopathy, bilateral: Secondary | ICD-10-CM | POA: Diagnosis not present

## 2018-01-30 DIAGNOSIS — H43813 Vitreous degeneration, bilateral: Secondary | ICD-10-CM

## 2018-02-26 DIAGNOSIS — Z79899 Other long term (current) drug therapy: Secondary | ICD-10-CM | POA: Diagnosis not present

## 2018-02-26 DIAGNOSIS — Z7984 Long term (current) use of oral hypoglycemic drugs: Secondary | ICD-10-CM | POA: Diagnosis not present

## 2018-02-26 DIAGNOSIS — K760 Fatty (change of) liver, not elsewhere classified: Secondary | ICD-10-CM | POA: Diagnosis not present

## 2018-02-26 DIAGNOSIS — Z794 Long term (current) use of insulin: Secondary | ICD-10-CM | POA: Diagnosis not present

## 2018-02-26 DIAGNOSIS — Z7982 Long term (current) use of aspirin: Secondary | ICD-10-CM | POA: Diagnosis not present

## 2018-02-26 DIAGNOSIS — E119 Type 2 diabetes mellitus without complications: Secondary | ICD-10-CM | POA: Diagnosis not present

## 2018-02-26 DIAGNOSIS — I10 Essential (primary) hypertension: Secondary | ICD-10-CM | POA: Diagnosis not present

## 2018-02-27 ENCOUNTER — Encounter (INDEPENDENT_AMBULATORY_CARE_PROVIDER_SITE_OTHER): Payer: Medicare Other | Admitting: Ophthalmology

## 2018-03-11 DIAGNOSIS — I10 Essential (primary) hypertension: Secondary | ICD-10-CM | POA: Diagnosis not present

## 2018-03-11 DIAGNOSIS — I2510 Atherosclerotic heart disease of native coronary artery without angina pectoris: Secondary | ICD-10-CM | POA: Diagnosis not present

## 2018-03-11 DIAGNOSIS — I129 Hypertensive chronic kidney disease with stage 1 through stage 4 chronic kidney disease, or unspecified chronic kidney disease: Secondary | ICD-10-CM | POA: Diagnosis not present

## 2018-03-11 DIAGNOSIS — E11621 Type 2 diabetes mellitus with foot ulcer: Secondary | ICD-10-CM | POA: Diagnosis not present

## 2018-03-11 DIAGNOSIS — E782 Mixed hyperlipidemia: Secondary | ICD-10-CM | POA: Diagnosis not present

## 2018-03-11 DIAGNOSIS — E1169 Type 2 diabetes mellitus with other specified complication: Secondary | ICD-10-CM | POA: Diagnosis not present

## 2018-03-11 DIAGNOSIS — N183 Chronic kidney disease, stage 3 (moderate): Secondary | ICD-10-CM | POA: Diagnosis not present

## 2018-03-14 ENCOUNTER — Encounter (INDEPENDENT_AMBULATORY_CARE_PROVIDER_SITE_OTHER): Payer: Medicare Other | Admitting: Ophthalmology

## 2018-03-14 DIAGNOSIS — H35033 Hypertensive retinopathy, bilateral: Secondary | ICD-10-CM

## 2018-03-14 DIAGNOSIS — E113513 Type 2 diabetes mellitus with proliferative diabetic retinopathy with macular edema, bilateral: Secondary | ICD-10-CM | POA: Diagnosis not present

## 2018-03-14 DIAGNOSIS — H4312 Vitreous hemorrhage, left eye: Secondary | ICD-10-CM

## 2018-03-14 DIAGNOSIS — I10 Essential (primary) hypertension: Secondary | ICD-10-CM

## 2018-03-14 DIAGNOSIS — E11311 Type 2 diabetes mellitus with unspecified diabetic retinopathy with macular edema: Secondary | ICD-10-CM | POA: Diagnosis not present

## 2018-03-14 DIAGNOSIS — H43813 Vitreous degeneration, bilateral: Secondary | ICD-10-CM

## 2018-03-14 DIAGNOSIS — H2513 Age-related nuclear cataract, bilateral: Secondary | ICD-10-CM

## 2018-03-15 DIAGNOSIS — L97512 Non-pressure chronic ulcer of other part of right foot with fat layer exposed: Secondary | ICD-10-CM | POA: Diagnosis not present

## 2018-03-15 DIAGNOSIS — E1151 Type 2 diabetes mellitus with diabetic peripheral angiopathy without gangrene: Secondary | ICD-10-CM | POA: Diagnosis not present

## 2018-03-19 DIAGNOSIS — E1151 Type 2 diabetes mellitus with diabetic peripheral angiopathy without gangrene: Secondary | ICD-10-CM | POA: Diagnosis not present

## 2018-03-19 DIAGNOSIS — L97512 Non-pressure chronic ulcer of other part of right foot with fat layer exposed: Secondary | ICD-10-CM | POA: Diagnosis not present

## 2018-03-22 DIAGNOSIS — E1151 Type 2 diabetes mellitus with diabetic peripheral angiopathy without gangrene: Secondary | ICD-10-CM | POA: Diagnosis not present

## 2018-03-22 DIAGNOSIS — L97512 Non-pressure chronic ulcer of other part of right foot with fat layer exposed: Secondary | ICD-10-CM | POA: Diagnosis not present

## 2018-03-27 DIAGNOSIS — I739 Peripheral vascular disease, unspecified: Secondary | ICD-10-CM | POA: Diagnosis not present

## 2018-03-27 DIAGNOSIS — M86171 Other acute osteomyelitis, right ankle and foot: Secondary | ICD-10-CM | POA: Diagnosis not present

## 2018-03-27 DIAGNOSIS — E1122 Type 2 diabetes mellitus with diabetic chronic kidney disease: Secondary | ICD-10-CM | POA: Diagnosis not present

## 2018-03-27 DIAGNOSIS — I129 Hypertensive chronic kidney disease with stage 1 through stage 4 chronic kidney disease, or unspecified chronic kidney disease: Secondary | ICD-10-CM | POA: Diagnosis not present

## 2018-03-27 DIAGNOSIS — I4892 Unspecified atrial flutter: Secondary | ICD-10-CM | POA: Diagnosis not present

## 2018-03-27 DIAGNOSIS — Z7984 Long term (current) use of oral hypoglycemic drugs: Secondary | ICD-10-CM | POA: Diagnosis not present

## 2018-03-27 DIAGNOSIS — N183 Chronic kidney disease, stage 3 (moderate): Secondary | ICD-10-CM | POA: Diagnosis not present

## 2018-03-27 DIAGNOSIS — L97811 Non-pressure chronic ulcer of other part of right lower leg limited to breakdown of skin: Secondary | ICD-10-CM | POA: Diagnosis not present

## 2018-03-27 DIAGNOSIS — I2510 Atherosclerotic heart disease of native coronary artery without angina pectoris: Secondary | ICD-10-CM | POA: Diagnosis not present

## 2018-03-27 DIAGNOSIS — D696 Thrombocytopenia, unspecified: Secondary | ICD-10-CM | POA: Diagnosis not present

## 2018-03-27 DIAGNOSIS — Z7982 Long term (current) use of aspirin: Secondary | ICD-10-CM | POA: Diagnosis not present

## 2018-03-27 DIAGNOSIS — E782 Mixed hyperlipidemia: Secondary | ICD-10-CM | POA: Diagnosis not present

## 2018-03-29 DIAGNOSIS — J189 Pneumonia, unspecified organism: Secondary | ICD-10-CM | POA: Diagnosis not present

## 2018-03-29 DIAGNOSIS — J90 Pleural effusion, not elsewhere classified: Secondary | ICD-10-CM | POA: Diagnosis not present

## 2018-03-29 DIAGNOSIS — K760 Fatty (change of) liver, not elsewhere classified: Secondary | ICD-10-CM | POA: Diagnosis not present

## 2018-03-29 DIAGNOSIS — I4891 Unspecified atrial fibrillation: Secondary | ICD-10-CM | POA: Diagnosis not present

## 2018-03-29 DIAGNOSIS — I119 Hypertensive heart disease without heart failure: Secondary | ICD-10-CM | POA: Diagnosis not present

## 2018-03-29 DIAGNOSIS — J9601 Acute respiratory failure with hypoxia: Secondary | ICD-10-CM | POA: Diagnosis not present

## 2018-03-29 DIAGNOSIS — R188 Other ascites: Secondary | ICD-10-CM | POA: Diagnosis not present

## 2018-03-30 ENCOUNTER — Inpatient Hospital Stay (HOSPITAL_COMMUNITY)
Admission: AD | Admit: 2018-03-30 | Discharge: 2018-04-17 | DRG: 207 | Disposition: E | Payer: Medicare Other | Source: Other Acute Inpatient Hospital | Attending: Pulmonary Disease | Admitting: Pulmonary Disease

## 2018-03-30 ENCOUNTER — Observation Stay (HOSPITAL_BASED_OUTPATIENT_CLINIC_OR_DEPARTMENT_OTHER): Payer: Medicare Other

## 2018-03-30 ENCOUNTER — Observation Stay (HOSPITAL_COMMUNITY): Payer: Medicare Other

## 2018-03-30 DIAGNOSIS — Z978 Presence of other specified devices: Secondary | ICD-10-CM

## 2018-03-30 DIAGNOSIS — E876 Hypokalemia: Secondary | ICD-10-CM | POA: Diagnosis not present

## 2018-03-30 DIAGNOSIS — E8770 Fluid overload, unspecified: Secondary | ICD-10-CM | POA: Diagnosis not present

## 2018-03-30 DIAGNOSIS — I10 Essential (primary) hypertension: Secondary | ICD-10-CM | POA: Diagnosis present

## 2018-03-30 DIAGNOSIS — E870 Hyperosmolality and hypernatremia: Secondary | ICD-10-CM | POA: Diagnosis not present

## 2018-03-30 DIAGNOSIS — Z4659 Encounter for fitting and adjustment of other gastrointestinal appliance and device: Secondary | ICD-10-CM

## 2018-03-30 DIAGNOSIS — R32 Unspecified urinary incontinence: Secondary | ICD-10-CM | POA: Diagnosis not present

## 2018-03-30 DIAGNOSIS — Z9114 Patient's other noncompliance with medication regimen: Secondary | ICD-10-CM

## 2018-03-30 DIAGNOSIS — Z79899 Other long term (current) drug therapy: Secondary | ICD-10-CM

## 2018-03-30 DIAGNOSIS — Z515 Encounter for palliative care: Secondary | ICD-10-CM | POA: Diagnosis present

## 2018-03-30 DIAGNOSIS — R062 Wheezing: Secondary | ICD-10-CM

## 2018-03-30 DIAGNOSIS — F05 Delirium due to known physiological condition: Secondary | ICD-10-CM | POA: Diagnosis present

## 2018-03-30 DIAGNOSIS — E11622 Type 2 diabetes mellitus with other skin ulcer: Secondary | ICD-10-CM | POA: Diagnosis present

## 2018-03-30 DIAGNOSIS — I469 Cardiac arrest, cause unspecified: Secondary | ICD-10-CM | POA: Diagnosis not present

## 2018-03-30 DIAGNOSIS — G9340 Encephalopathy, unspecified: Secondary | ICD-10-CM | POA: Diagnosis not present

## 2018-03-30 DIAGNOSIS — B9781 Human metapneumovirus as the cause of diseases classified elsewhere: Secondary | ICD-10-CM | POA: Diagnosis not present

## 2018-03-30 DIAGNOSIS — E1140 Type 2 diabetes mellitus with diabetic neuropathy, unspecified: Secondary | ICD-10-CM | POA: Diagnosis present

## 2018-03-30 DIAGNOSIS — L98499 Non-pressure chronic ulcer of skin of other sites with unspecified severity: Secondary | ICD-10-CM | POA: Diagnosis not present

## 2018-03-30 DIAGNOSIS — G931 Anoxic brain damage, not elsewhere classified: Secondary | ICD-10-CM | POA: Diagnosis not present

## 2018-03-30 DIAGNOSIS — I119 Hypertensive heart disease without heart failure: Secondary | ICD-10-CM | POA: Diagnosis not present

## 2018-03-30 DIAGNOSIS — N170 Acute kidney failure with tubular necrosis: Secondary | ICD-10-CM | POA: Diagnosis not present

## 2018-03-30 DIAGNOSIS — I4819 Other persistent atrial fibrillation: Secondary | ICD-10-CM | POA: Diagnosis not present

## 2018-03-30 DIAGNOSIS — E785 Hyperlipidemia, unspecified: Secondary | ICD-10-CM | POA: Diagnosis present

## 2018-03-30 DIAGNOSIS — Z6831 Body mass index (BMI) 31.0-31.9, adult: Secondary | ICD-10-CM

## 2018-03-30 DIAGNOSIS — J811 Chronic pulmonary edema: Secondary | ICD-10-CM | POA: Diagnosis present

## 2018-03-30 DIAGNOSIS — Z7984 Long term (current) use of oral hypoglycemic drugs: Secondary | ICD-10-CM

## 2018-03-30 DIAGNOSIS — I4891 Unspecified atrial fibrillation: Secondary | ICD-10-CM | POA: Diagnosis not present

## 2018-03-30 DIAGNOSIS — R140 Abdominal distension (gaseous): Secondary | ICD-10-CM

## 2018-03-30 DIAGNOSIS — J123 Human metapneumovirus pneumonia: Secondary | ICD-10-CM | POA: Diagnosis not present

## 2018-03-30 DIAGNOSIS — N179 Acute kidney failure, unspecified: Secondary | ICD-10-CM | POA: Diagnosis not present

## 2018-03-30 DIAGNOSIS — F200 Paranoid schizophrenia: Secondary | ICD-10-CM | POA: Diagnosis present

## 2018-03-30 DIAGNOSIS — J9690 Respiratory failure, unspecified, unspecified whether with hypoxia or hypercapnia: Secondary | ICD-10-CM

## 2018-03-30 DIAGNOSIS — J90 Pleural effusion, not elsewhere classified: Secondary | ICD-10-CM | POA: Diagnosis not present

## 2018-03-30 DIAGNOSIS — J9601 Acute respiratory failure with hypoxia: Principal | ICD-10-CM | POA: Diagnosis present

## 2018-03-30 DIAGNOSIS — I361 Nonrheumatic tricuspid (valve) insufficiency: Secondary | ICD-10-CM

## 2018-03-30 DIAGNOSIS — K7460 Unspecified cirrhosis of liver: Secondary | ICD-10-CM | POA: Diagnosis not present

## 2018-03-30 DIAGNOSIS — J9602 Acute respiratory failure with hypercapnia: Secondary | ICD-10-CM | POA: Diagnosis not present

## 2018-03-30 DIAGNOSIS — Z7901 Long term (current) use of anticoagulants: Secondary | ICD-10-CM

## 2018-03-30 DIAGNOSIS — Z7189 Other specified counseling: Secondary | ICD-10-CM | POA: Diagnosis not present

## 2018-03-30 DIAGNOSIS — J189 Pneumonia, unspecified organism: Secondary | ICD-10-CM

## 2018-03-30 DIAGNOSIS — Z66 Do not resuscitate: Secondary | ICD-10-CM | POA: Diagnosis not present

## 2018-03-30 DIAGNOSIS — H518 Other specified disorders of binocular movement: Secondary | ICD-10-CM | POA: Diagnosis present

## 2018-03-30 DIAGNOSIS — I2510 Atherosclerotic heart disease of native coronary artery without angina pectoris: Secondary | ICD-10-CM | POA: Diagnosis not present

## 2018-03-30 DIAGNOSIS — R0689 Other abnormalities of breathing: Secondary | ICD-10-CM | POA: Diagnosis not present

## 2018-03-30 DIAGNOSIS — R188 Other ascites: Secondary | ICD-10-CM

## 2018-03-30 DIAGNOSIS — K7290 Hepatic failure, unspecified without coma: Secondary | ICD-10-CM | POA: Diagnosis present

## 2018-03-30 DIAGNOSIS — I4811 Longstanding persistent atrial fibrillation: Secondary | ICD-10-CM | POA: Diagnosis not present

## 2018-03-30 DIAGNOSIS — Z8249 Family history of ischemic heart disease and other diseases of the circulatory system: Secondary | ICD-10-CM

## 2018-03-30 DIAGNOSIS — E669 Obesity, unspecified: Secondary | ICD-10-CM | POA: Diagnosis present

## 2018-03-30 DIAGNOSIS — E1151 Type 2 diabetes mellitus with diabetic peripheral angiopathy without gangrene: Secondary | ICD-10-CM | POA: Diagnosis present

## 2018-03-30 DIAGNOSIS — R109 Unspecified abdominal pain: Secondary | ICD-10-CM | POA: Diagnosis not present

## 2018-03-30 DIAGNOSIS — I468 Cardiac arrest due to other underlying condition: Secondary | ICD-10-CM | POA: Diagnosis not present

## 2018-03-30 DIAGNOSIS — Z95828 Presence of other vascular implants and grafts: Secondary | ICD-10-CM

## 2018-03-30 DIAGNOSIS — Z9911 Dependence on respirator [ventilator] status: Secondary | ICD-10-CM | POA: Diagnosis not present

## 2018-03-30 DIAGNOSIS — G9389 Other specified disorders of brain: Secondary | ICD-10-CM | POA: Diagnosis present

## 2018-03-30 DIAGNOSIS — G9341 Metabolic encephalopathy: Secondary | ICD-10-CM | POA: Diagnosis not present

## 2018-03-30 DIAGNOSIS — D696 Thrombocytopenia, unspecified: Secondary | ICD-10-CM | POA: Diagnosis present

## 2018-03-30 LAB — APTT: aPTT: 38 seconds — ABNORMAL HIGH (ref 24–36)

## 2018-03-30 LAB — CBC WITH DIFFERENTIAL/PLATELET
Abs Immature Granulocytes: 0.03 10*3/uL (ref 0.00–0.07)
Basophils Absolute: 0.1 10*3/uL (ref 0.0–0.1)
Basophils Relative: 1 %
EOS ABS: 0 10*3/uL (ref 0.0–0.5)
Eosinophils Relative: 0 %
HCT: 46.1 % (ref 39.0–52.0)
Hemoglobin: 13.5 g/dL (ref 13.0–17.0)
IMMATURE GRANULOCYTES: 0 %
Lymphocytes Relative: 7 %
Lymphs Abs: 0.5 10*3/uL — ABNORMAL LOW (ref 0.7–4.0)
MCH: 22.7 pg — ABNORMAL LOW (ref 26.0–34.0)
MCHC: 29.3 g/dL — ABNORMAL LOW (ref 30.0–36.0)
MCV: 77.3 fL — ABNORMAL LOW (ref 80.0–100.0)
Monocytes Absolute: 1 10*3/uL (ref 0.1–1.0)
Monocytes Relative: 13 %
Neutro Abs: 5.8 10*3/uL (ref 1.7–7.7)
Neutrophils Relative %: 79 %
Platelets: 161 10*3/uL (ref 150–400)
RBC: 5.96 MIL/uL — ABNORMAL HIGH (ref 4.22–5.81)
RDW: 19.7 % — ABNORMAL HIGH (ref 11.5–15.5)
WBC: 7.3 10*3/uL (ref 4.0–10.5)
nRBC: 0 % (ref 0.0–0.2)

## 2018-03-30 LAB — COMPREHENSIVE METABOLIC PANEL
ALK PHOS: 142 U/L — AB (ref 38–126)
ALT: 22 U/L (ref 0–44)
AST: 34 U/L (ref 15–41)
Albumin: 3.8 g/dL (ref 3.5–5.0)
Anion gap: 9 (ref 5–15)
BUN: 14 mg/dL (ref 8–23)
CALCIUM: 8.7 mg/dL — AB (ref 8.9–10.3)
CO2: 26 mmol/L (ref 22–32)
Chloride: 99 mmol/L (ref 98–111)
Creatinine, Ser: 0.91 mg/dL (ref 0.61–1.24)
GFR calc Af Amer: >60 mL/min (ref >60)
GFR calc non Af Amer: >60 mL/min (ref >60)
Glucose, Bld: 76 mg/dL (ref 70–99)
Potassium: 4 mmol/L (ref 3.5–5.1)
Sodium: 134 mmol/L — ABNORMAL LOW (ref 135–145)
Total Bilirubin: 1 mg/dL (ref 0.3–1.2)
Total Protein: 8.2 g/dL — ABNORMAL HIGH (ref 6.5–8.1)

## 2018-03-30 LAB — PROTIME-INR
INR: 1.2 (ref 0.8–1.2)
Prothrombin Time: 15.4 seconds — ABNORMAL HIGH (ref 11.4–15.2)

## 2018-03-30 LAB — GLUCOSE, CAPILLARY
GLUCOSE-CAPILLARY: 122 mg/dL — AB (ref 70–99)
Glucose-Capillary: 72 mg/dL (ref 70–99)
Glucose-Capillary: 61 mg/dL — ABNORMAL LOW (ref 70–99)
Glucose-Capillary: 104 mg/dL — ABNORMAL HIGH (ref 70–99)

## 2018-03-30 LAB — ECHOCARDIOGRAM COMPLETE
Height: 72 in
Weight: 4243.41 oz

## 2018-03-30 LAB — LACTIC ACID, PLASMA: Lactic Acid, Venous: 1 mmol/L (ref 0.5–1.9)

## 2018-03-30 LAB — MRSA PCR SCREENING: MRSA by PCR: NEGATIVE

## 2018-03-30 MED ORDER — HYDRALAZINE HCL 10 MG PO TABS
10.0000 mg | ORAL_TABLET | Freq: Three times a day (TID) | ORAL | Status: DC
  Administered 2018-03-30 – 2018-04-09 (×31): 10 mg via ORAL
  Filled 2018-03-30 (×31): qty 1

## 2018-03-30 MED ORDER — ALBUMIN HUMAN 25 % IV SOLN
50.0000 g | Freq: Once | INTRAVENOUS | Status: DC
  Filled 2018-03-30: qty 200
  Filled 2018-03-30: qty 100

## 2018-03-30 MED ORDER — ONDANSETRON HCL 4 MG/2ML IJ SOLN
4.0000 mg | Freq: Four times a day (QID) | INTRAMUSCULAR | Status: DC | PRN
  Administered 2018-03-30: 4 mg via INTRAVENOUS
  Filled 2018-03-30: qty 2

## 2018-03-30 MED ORDER — HALOPERIDOL LACTATE 5 MG/ML IJ SOLN
5.0000 mg | Freq: Four times a day (QID) | INTRAMUSCULAR | Status: DC | PRN
  Administered 2018-03-30: 5 mg via INTRAVENOUS
  Filled 2018-03-30: qty 1

## 2018-03-30 MED ORDER — DEXTROSE-NACL 5-0.9 % IV SOLN
INTRAVENOUS | Status: DC
  Administered 2018-03-30 – 2018-04-03 (×7): via INTRAVENOUS

## 2018-03-30 MED ORDER — HEPARIN SODIUM (PORCINE) 5000 UNIT/ML IJ SOLN
5000.0000 [IU] | Freq: Three times a day (TID) | INTRAMUSCULAR | Status: DC
  Administered 2018-03-30 – 2018-04-05 (×18): 5000 [IU] via SUBCUTANEOUS
  Filled 2018-03-30 (×18): qty 1

## 2018-03-30 MED ORDER — SODIUM CHLORIDE 0.9 % IV SOLN
INTRAVENOUS | Status: DC
  Administered 2018-03-30: 11:00:00 via INTRAVENOUS

## 2018-03-30 MED ORDER — SODIUM CHLORIDE 0.9 % IV SOLN
500.0000 mg | INTRAVENOUS | Status: AC
  Administered 2018-03-30 – 2018-04-03 (×5): 500 mg via INTRAVENOUS
  Filled 2018-03-30 (×5): qty 500

## 2018-03-30 MED ORDER — DILTIAZEM HCL ER COATED BEADS 120 MG PO CP24
120.0000 mg | ORAL_CAPSULE | Freq: Every day | ORAL | Status: DC
  Administered 2018-03-30: 120 mg via ORAL
  Filled 2018-03-30: qty 1

## 2018-03-30 MED ORDER — DEXTROSE 50 % IV SOLN
INTRAVENOUS | Status: AC
  Administered 2018-03-30: 50 mL
  Filled 2018-03-30: qty 50

## 2018-03-30 MED ORDER — LIDOCAINE HCL (PF) 1 % IJ SOLN
INTRAMUSCULAR | Status: AC
  Filled 2018-03-30: qty 30

## 2018-03-30 MED ORDER — CARVEDILOL 25 MG PO TABS
25.0000 mg | ORAL_TABLET | Freq: Two times a day (BID) | ORAL | Status: DC
  Administered 2018-03-30 – 2018-03-31 (×2): 25 mg via ORAL
  Filled 2018-03-30 (×2): qty 1

## 2018-03-30 MED ORDER — SODIUM CHLORIDE 0.9 % IV SOLN
2.0000 g | INTRAVENOUS | Status: AC
  Administered 2018-03-30 – 2018-04-03 (×5): 2 g via INTRAVENOUS
  Filled 2018-03-30 (×5): qty 20

## 2018-03-30 NOTE — Progress Notes (Signed)
IR requested for possible image-guided paracentesis.  Limited abdominal ultrasound revealed no fluid that could be safely accessed with procedure today. Images sent to Dr. Pascal Lux for review. Informed patient that procedure will not occur today. Dr. Lynetta Mare made aware.  IR available in future if needed.  Bea Graff Americo Vallery, PA-C 04/15/2018, 10:32 AM

## 2018-03-30 NOTE — Progress Notes (Signed)
Pt left unit for ultrasound dept.accompanied by transporter.

## 2018-03-30 NOTE — Progress Notes (Signed)
Hypoglycemic Event  CBG: 61  Treatment: 50% DEXTROSE H2O 60 MLS  Symptoms:NONE Follow-up CBG: [id number] CBG Result122 Possible Reasons for Event: NPO MD notified:\NURSE PRACTITIONER BABCOCK

## 2018-03-30 NOTE — Progress Notes (Signed)
Pt returned to unit , care continues.

## 2018-03-30 NOTE — Progress Notes (Signed)
  Echocardiogram 2D Echocardiogram has been performed.  TECHNICALLY DIFFICULT STUDY. Patient yelling out he is in pain and cannot get comfortable. Wants test to be ended.   Samuel Mccoy L Androw 04/09/2018, 1:42 PM

## 2018-03-30 NOTE — H&P (Signed)
NAME:  Samuel Mccoy, MRN:  [id number], DOB:  14-Oct-1950, LOS: 1 ADMISSION DATE:  04/12/2018, CONSULTATION DATE:  03/17/2018 REFERRING MD:  Julian [facility], CHIEF COMPLAINT: Abdominal pain  HPI/course in hospital  68 year old man.  Poor historian.  Transferred from [facility] for potential paracentesis.  Presented there with generalized weakness, dyspnea worsened abdominal pain and distention and urinary incontinence which has been present for the last several days.  Denies any fever chills or nausea vomiting or diarrhea.  Reported their abdomen and legs are more swollen than normal.   No sick contacts.  Lives alone.  Schizophrenia and cirrhosis by history.  Initially attempted transfer to hospitalist service but because patient had been placed on BiPAP at [facility] he was refused by the hospitalist.  Review of investigations from [facility] show CT of the abdomen consistent with cirrhosis with a small amount of ascites..  Generalized edema.  No evidence of bowel obstruction. Chest x-ray shows small right pleural effusion and possible infiltrate.  Laboratory investigations show no leukocytosis.  Normal hemoglobin with low MCV MCH.  Normal platelet count..  Normal arterial blood gas..  Urinalysis unremarkable.  Normal electrolytes with normal creatinine.  Elevated proBNP at 1600.  AST ALT unremarkable.  Negative ammonia negative troponin.  Past Medical History   Past Medical History:  Diagnosis Date  . Atrial fibrillation    a. persistent fib/flutter; rate controlled. refuses ablation or DCCV  . Chronic anticoagulation    Xarelto  . Coronary atherosclerosis of unspecified type of vessel, native or graft   . Diabetes mellitus, type II   . Diabetic neuropathy   . Hyperlipidemia   . Hypertension   . Non compliance w medication regimen    Per his mother  . Obese   . Paranoid type delusional disorder   . Peripheral vascular disease   . Thrombocytopenia      Past  Surgical History:  Procedure Laterality Date  . APPENDECTOMY    . CARDIOVASCULAR STRESS TEST  July 2014  . LEFT HEART CATH  2000  . TONSILECTOMY, ADENOIDECTOMY, BILATERAL MYRINGOTOMY AND TUBES       Review of Systems:   Review of Systems  Unable to perform ROS: Mental status change    Social History   reports that he has never smoked. He has never used smokeless tobacco. He reports that he does not drink alcohol or use drugs.   Family History   His family history includes Cancer in his father and mother; Deep vein thrombosis in his mother; Diabetes in his father; Heart attack in his father; Heart disease in his father; Hyperlipidemia in his father; Hypertension in his father and mother.   Allergies No Known Allergies   Home Medications  Prior to Admission medications   Medication Sig Start Date End Date Taking? Authorizing Provider  acidophilus (RISAQUAD) CAPS capsule Take 1 capsule by mouth 2 (two) times daily.  11/18/13   [provider]  aspirin EC 81 MG tablet Take 162 mg by mouth 2 (two) times daily.     [provider]  beta carotene w/minerals (OCUVITE) tablet Take 1 tablet by mouth daily.    [provider]  carvedilol (COREG) 25 MG tablet Take 25 mg by mouth 2 (two) times daily with a meal.    [provider]  cloNIDine (CATAPRES) 0.3 MG tablet Take 1 tablet (0.3 mg total) by mouth at bedtime. 12/12/13   Eileen Stanford, PA-C  Cyanocobalamin 1000 MCG/ML LIQD Take 1  mL by mouth daily.    [provider]  diltiazem (CARDIZEM CD) 120 MG 24 hr capsule TAKE 1 CAPSULE BY MOUTH ONCE A DAY 11/17/14   Barrett, Rhonda G, PA-C  glyBURIDE-metformin (GLUCOVANCE) 5-500 MG per tablet Take 1 tablet by mouth 2 (two) times daily with a meal.     [provider]  hydrALAZINE (APRESOLINE) 10 MG tablet Take 1 tablet (10 mg total) by mouth every 8 (eight) hours. 12/12/13   Eileen Stanford, PA-C  hydrochlorothiazide (HYDRODIURIL) 25 MG  tablet Take 25 mg by mouth daily.    [provider]  lisinopril (PRINIVIL,ZESTRIL) 20 MG tablet Take 1 tablet (20 mg total) by mouth daily. 07/03/13   Barrett, Evelene Croon, PA-C  magnesium oxide (MAG-OX) 400 (241.3 MG) MG tablet Take 1 tablet (400 mg total) by mouth 2 (two) times daily. 07/03/13   Barrett, Evelene Croon, PA-C  pravastatin (PRAVACHOL) 40 MG tablet Take 40 mg by mouth daily.    [provider]  rivaroxaban (XARELTO) 20 MG TABS tablet Take 1 tablet (20 mg total) by mouth daily with supper. Patient not taking: Reported on 12/11/2013 07/03/13   Barrett, Evelene Croon, PA-C     Interim history/subjective:  At present the patient remains an unreliable historian but consistently complains of generalized abdominal pain.  The nurse has confirmed urinary incontinence.  IR performed a limited ultrasound and did not find a sufficient pocket of ascites to drain.  Objective   Blood pressure (!) 176/104, temperature 97.6 F (36.4 C), temperature source Axillary, height 6' (1.829 m), weight 120.3 kg.        Intake/Output Summary (Last 24 hours) at 04/12/2018 1059 Last data filed at 04/02/2018 1042 Gross per 24 hour  Intake -  Output 425 ml  Net -425 ml   Filed Weights   03/18/2018 0704  Weight: 120.3 kg    Examination: Physical Exam  Constitutional: He appears well-developed and well-nourished. He appears listless.  HENT:  Head: Normocephalic and atraumatic.  Eyes: No scleral icterus.  Neck: Neck supple. No JVD present. No tracheal deviation present.  Cardiovascular: Regular rhythm and normal heart sounds.  Respiratory: Breath sounds normal.  GI: He exhibits distension. He exhibits no mass. There is hepatosplenomegaly. There is abdominal tenderness (diffuse pain). There is no rigidity, no rebound, no guarding and no CVA tenderness. No hernia. Hernia confirmed negative in the right inguinal area and confirmed negative in the left inguinal area.  Stigmata of chronic liver  disease.  Genitourinary:    Testes normal.  Right testis shows no tenderness. Left testis shows no tenderness.    Genitourinary Comments: + urinary incontinence.   Neurological: He has normal strength. He appears listless. No cranial nerve deficit.  No asterixis present  Skin: Skin is warm and dry.  +2 bilateral lower extremity edema with venous stasis changes.  Psychiatric: His mood appears anxious. He is agitated.     Ancillary tests (personally reviewed)  CBC: No results for input(s): WBC, NEUTROABS, HGB, HCT, MCV, PLT in the last 168 hours.  Basic Metabolic Panel: No results for input(s): NA, K, CL, CO2, GLUCOSE, BUN, CREATININE, CALCIUM, MG, PHOS in the last 168 hours. GFR: CrCl cannot be calculated (Patient's most recent lab result is older than the maximum 21 days allowed.). No results for input(s): PROCALCITON, WBC, LATICACIDVEN in the last 168 hours.  Liver Function Tests: No results for input(s): AST, ALT, ALKPHOS, BILITOT, PROT, ALBUMIN in the last 168 hours. No results for input(s): LIPASE, AMYLASE  in the last 168 hours. No results for input(s): AMMONIA in the last 168 hours.  ABG No results found for: PHART, PCO2ART, PO2ART, HCO3, TCO2, ACIDBASEDEF, O2SAT   Coagulation Profile: No results for input(s): INR, PROTIME in the last 168 hours.  Cardiac Enzymes: No results for input(s): CKTOTAL, CKMB, CKMBINDEX, TROPONINI in the last 168 hours.  HbA1C: No results found for: HGBA1C  CBG: No results for input(s): GLUCAP in the last 168 hours.    Assessment & Plan:  Acute abdominal pain of unclear etiology.  His abdomen is benign on examination and the patient is not able to show precisely where it hurts. Acute urinary incontinence.  It may be related to bladder distention and incomplete voiding even though creatinine is not yet elevated. Acute confusional state.  Does not appear to have hepatic encephalopathy.  Likely related to schizophrenia, or delirium from  intercurrent illness. Increasing edema with elevated proBNP  Plan:  Place Foley catheter to rule out obstructive uropathy Repeat lab work including lactate Consider CT angiogram to rule out gut ischemia. No signs of infection at present.  Will hold on antibiotics. Hold home oral medications until situation clarified. N.p.o. for now except for ice chips, IV maintenance fluids  Best practice:  Diet: N.p.o. Pain/Anxiety/Delirium protocol (if indicated): None VAP protocol (if indicated): Not indicated DVT prophylaxis: Subcu heparin GI prophylaxis: Not required Urinary catheter: Guide hemodynamic management and Possible urethral obstruction Glucose control: Monitor Mobility: Up ad lib. Code Status: Full Family Communication: No family present, will attempt to contact. Disposition: Stepdown.  Likely able to transition to floor tomorrow.   Critical care time: Not applicable.    Kipp Brood, MD [facility] ICU Physician [place]  Pager: [phone] Mobile: [phone] After hours: [phone].  03/18/2018, 10:59 AM

## 2018-03-30 NOTE — Progress Notes (Signed)
Pharmacy Antibiotic Note  Samuel Mccoy is a 68 y.o. male admitted on 03/22/2018 with pneumonia.  Pharmacy has been consulted for rocephin dosing (he is also on azithromycin). -MRSA PCR negative  Plan: -Rocephin 2gm IV q24h -No dose changes will be needed -Will sign off. Please contact pharmacy with any other needs.  Thank you  Hildred Laser, PharmD Clinical Pharmacist **Pharmacist phone directory can now be found on [email or web address] ([email or web address]).  Listed under [place].

## 2018-03-31 ENCOUNTER — Inpatient Hospital Stay (HOSPITAL_COMMUNITY): Payer: Medicare Other | Admitting: Anesthesiology

## 2018-03-31 ENCOUNTER — Inpatient Hospital Stay (HOSPITAL_COMMUNITY): Payer: Medicare Other

## 2018-03-31 ENCOUNTER — Inpatient Hospital Stay: Payer: Self-pay

## 2018-03-31 DIAGNOSIS — G9340 Encephalopathy, unspecified: Secondary | ICD-10-CM | POA: Diagnosis not present

## 2018-03-31 DIAGNOSIS — R9082 White matter disease, unspecified: Secondary | ICD-10-CM | POA: Diagnosis not present

## 2018-03-31 DIAGNOSIS — Z515 Encounter for palliative care: Secondary | ICD-10-CM | POA: Diagnosis not present

## 2018-03-31 DIAGNOSIS — E1140 Type 2 diabetes mellitus with diabetic neuropathy, unspecified: Secondary | ICD-10-CM | POA: Diagnosis present

## 2018-03-31 DIAGNOSIS — R32 Unspecified urinary incontinence: Secondary | ICD-10-CM | POA: Diagnosis present

## 2018-03-31 DIAGNOSIS — R188 Other ascites: Secondary | ICD-10-CM | POA: Diagnosis present

## 2018-03-31 DIAGNOSIS — F05 Delirium due to known physiological condition: Secondary | ICD-10-CM | POA: Diagnosis present

## 2018-03-31 DIAGNOSIS — N170 Acute kidney failure with tubular necrosis: Secondary | ICD-10-CM | POA: Diagnosis not present

## 2018-03-31 DIAGNOSIS — I2510 Atherosclerotic heart disease of native coronary artery without angina pectoris: Secondary | ICD-10-CM | POA: Diagnosis present

## 2018-03-31 DIAGNOSIS — B9781 Human metapneumovirus as the cause of diseases classified elsewhere: Secondary | ICD-10-CM | POA: Diagnosis not present

## 2018-03-31 DIAGNOSIS — I469 Cardiac arrest, cause unspecified: Secondary | ICD-10-CM

## 2018-03-31 DIAGNOSIS — F200 Paranoid schizophrenia: Secondary | ICD-10-CM | POA: Diagnosis present

## 2018-03-31 DIAGNOSIS — J9601 Acute respiratory failure with hypoxia: Secondary | ICD-10-CM | POA: Diagnosis not present

## 2018-03-31 DIAGNOSIS — R140 Abdominal distension (gaseous): Secondary | ICD-10-CM | POA: Diagnosis not present

## 2018-03-31 DIAGNOSIS — G931 Anoxic brain damage, not elsewhere classified: Secondary | ICD-10-CM | POA: Diagnosis not present

## 2018-03-31 DIAGNOSIS — Z7189 Other specified counseling: Secondary | ICD-10-CM | POA: Diagnosis not present

## 2018-03-31 DIAGNOSIS — J9602 Acute respiratory failure with hypercapnia: Secondary | ICD-10-CM | POA: Diagnosis not present

## 2018-03-31 DIAGNOSIS — Z452 Encounter for adjustment and management of vascular access device: Secondary | ICD-10-CM | POA: Diagnosis not present

## 2018-03-31 DIAGNOSIS — R0689 Other abnormalities of breathing: Secondary | ICD-10-CM | POA: Diagnosis not present

## 2018-03-31 DIAGNOSIS — J811 Chronic pulmonary edema: Secondary | ICD-10-CM | POA: Diagnosis present

## 2018-03-31 DIAGNOSIS — N179 Acute kidney failure, unspecified: Secondary | ICD-10-CM | POA: Diagnosis not present

## 2018-03-31 DIAGNOSIS — I4819 Other persistent atrial fibrillation: Secondary | ICD-10-CM | POA: Diagnosis present

## 2018-03-31 DIAGNOSIS — Z931 Gastrostomy status: Secondary | ICD-10-CM | POA: Diagnosis not present

## 2018-03-31 DIAGNOSIS — K7460 Unspecified cirrhosis of liver: Secondary | ICD-10-CM | POA: Diagnosis not present

## 2018-03-31 DIAGNOSIS — Z4682 Encounter for fitting and adjustment of non-vascular catheter: Secondary | ICD-10-CM | POA: Diagnosis not present

## 2018-03-31 DIAGNOSIS — J90 Pleural effusion, not elsewhere classified: Secondary | ICD-10-CM | POA: Diagnosis not present

## 2018-03-31 DIAGNOSIS — E8770 Fluid overload, unspecified: Secondary | ICD-10-CM | POA: Diagnosis present

## 2018-03-31 DIAGNOSIS — J123 Human metapneumovirus pneumonia: Secondary | ICD-10-CM | POA: Diagnosis not present

## 2018-03-31 DIAGNOSIS — J189 Pneumonia, unspecified organism: Secondary | ICD-10-CM | POA: Diagnosis not present

## 2018-03-31 DIAGNOSIS — R109 Unspecified abdominal pain: Secondary | ICD-10-CM | POA: Diagnosis not present

## 2018-03-31 DIAGNOSIS — R918 Other nonspecific abnormal finding of lung field: Secondary | ICD-10-CM | POA: Diagnosis not present

## 2018-03-31 DIAGNOSIS — K7290 Hepatic failure, unspecified without coma: Secondary | ICD-10-CM | POA: Diagnosis present

## 2018-03-31 DIAGNOSIS — Z9911 Dependence on respirator [ventilator] status: Secondary | ICD-10-CM | POA: Diagnosis not present

## 2018-03-31 DIAGNOSIS — G9341 Metabolic encephalopathy: Secondary | ICD-10-CM | POA: Diagnosis not present

## 2018-03-31 DIAGNOSIS — R0902 Hypoxemia: Secondary | ICD-10-CM | POA: Diagnosis not present

## 2018-03-31 DIAGNOSIS — L98499 Non-pressure chronic ulcer of skin of other sites with unspecified severity: Secondary | ICD-10-CM | POA: Diagnosis present

## 2018-03-31 DIAGNOSIS — E870 Hyperosmolality and hypernatremia: Secondary | ICD-10-CM | POA: Diagnosis not present

## 2018-03-31 DIAGNOSIS — J9690 Respiratory failure, unspecified, unspecified whether with hypoxia or hypercapnia: Secondary | ICD-10-CM | POA: Diagnosis not present

## 2018-03-31 DIAGNOSIS — E11622 Type 2 diabetes mellitus with other skin ulcer: Secondary | ICD-10-CM | POA: Diagnosis present

## 2018-03-31 DIAGNOSIS — Z66 Do not resuscitate: Secondary | ICD-10-CM | POA: Diagnosis present

## 2018-03-31 DIAGNOSIS — I4811 Longstanding persistent atrial fibrillation: Secondary | ICD-10-CM | POA: Diagnosis not present

## 2018-03-31 LAB — POCT I-STAT 7, (LYTES, BLD GAS, ICA,H+H)
Acid-Base Excess: 1 mmol/L (ref 0.0–2.0)
BICARBONATE: 30.4 mmol/L — AB (ref 20.0–28.0)
BICARBONATE: 28.7 mmol/L — AB (ref 20.0–28.0)
CALCIUM ION: 1.16 mmol/L (ref 1.15–1.40)
Calcium, Ion: 1.15 mmol/L (ref 1.15–1.40)
HCT: 43 % (ref 39.0–52.0)
HCT: 42 % (ref 39.0–52.0)
Hemoglobin: 14.6 g/dL (ref 13.0–17.0)
Hemoglobin: 14.3 g/dL (ref 13.0–17.0)
O2 Saturation: 92 %
O2 Saturation: 99 %
POTASSIUM: 5.1 mmol/L (ref 3.5–5.1)
Patient temperature: 38.1
Potassium: 4.8 mmol/L (ref 3.5–5.1)
Sodium: 135 mmol/L (ref 135–145)
Sodium: 139 mmol/L (ref 135–145)
TCO2: 33 mmol/L — ABNORMAL HIGH (ref 22–32)
TCO2: 30 mmol/L (ref 22–32)
pCO2 arterial: 79.2 mmHg (ref 32.0–48.0)
pCO2 arterial: 58.1 mmHg — ABNORMAL HIGH (ref 32.0–48.0)
pH, Arterial: 7.2 — ABNORMAL LOW (ref 7.350–7.450)
pH, Arterial: 7.307 — ABNORMAL LOW (ref 7.350–7.450)
pO2, Arterial: 85 mmHg (ref 83.0–108.0)
pO2, Arterial: 178 mmHg — ABNORMAL HIGH (ref 83.0–108.0)

## 2018-03-31 LAB — GLUCOSE, CAPILLARY
Glucose-Capillary: 126 mg/dL — ABNORMAL HIGH (ref 70–99)
Glucose-Capillary: 139 mg/dL — ABNORMAL HIGH (ref 70–99)
Glucose-Capillary: 144 mg/dL — ABNORMAL HIGH (ref 70–99)
Glucose-Capillary: 150 mg/dL — ABNORMAL HIGH (ref 70–99)

## 2018-03-31 LAB — COMPREHENSIVE METABOLIC PANEL
ALT: 23 U/L (ref 0–44)
AST: 37 U/L (ref 15–41)
Albumin: 3.5 g/dL (ref 3.5–5.0)
Alkaline Phosphatase: 140 U/L — ABNORMAL HIGH (ref 38–126)
Anion gap: 9 (ref 5–15)
BILIRUBIN TOTAL: 1 mg/dL (ref 0.3–1.2)
BUN: 19 mg/dL (ref 8–23)
CO2: 24 mmol/L (ref 22–32)
Calcium: 8.2 mg/dL — ABNORMAL LOW (ref 8.9–10.3)
Chloride: 101 mmol/L (ref 98–111)
Creatinine, Ser: 1 mg/dL (ref 0.61–1.24)
GFR calc Af Amer: >60 mL/min (ref >60)
GFR calc non Af Amer: >60 mL/min (ref >60)
Glucose, Bld: 138 mg/dL — ABNORMAL HIGH (ref 70–99)
POTASSIUM: 4.6 mmol/L (ref 3.5–5.1)
Sodium: 134 mmol/L — ABNORMAL LOW (ref 135–145)
TOTAL PROTEIN: 8 g/dL (ref 6.5–8.1)

## 2018-03-31 LAB — BASIC METABOLIC PANEL
ANION GAP: 9 (ref 5–15)
BUN: 25 mg/dL — ABNORMAL HIGH (ref 8–23)
CO2: 21 mmol/L — ABNORMAL LOW (ref 22–32)
Calcium: 7.1 mg/dL — ABNORMAL LOW (ref 8.9–10.3)
Chloride: 108 mmol/L (ref 98–111)
Creatinine, Ser: 1.34 mg/dL — ABNORMAL HIGH (ref 0.61–1.24)
GFR calc Af Amer: >60 mL/min (ref >60)
GFR calc non Af Amer: 55 mL/min — ABNORMAL LOW (ref >60)
Glucose, Bld: 146 mg/dL — ABNORMAL HIGH (ref 70–99)
Potassium: 5.1 mmol/L (ref 3.5–5.1)
Sodium: 138 mmol/L (ref 135–145)

## 2018-03-31 LAB — CBC
HCT: 46.6 % (ref 39.0–52.0)
Hemoglobin: 12.8 g/dL — ABNORMAL LOW (ref 13.0–17.0)
MCH: 22.7 pg — ABNORMAL LOW (ref 26.0–34.0)
MCHC: 27.5 g/dL — ABNORMAL LOW (ref 30.0–36.0)
MCV: 82.8 fL (ref 80.0–100.0)
PLATELETS: 149 10*3/uL — AB (ref 150–400)
RBC: 5.63 MIL/uL (ref 4.22–5.81)
RDW: 19.7 % — ABNORMAL HIGH (ref 11.5–15.5)
WBC: 11.6 10*3/uL — ABNORMAL HIGH (ref 4.0–10.5)
nRBC: 0 % (ref 0.0–0.2)

## 2018-03-31 LAB — HIV ANTIBODY (ROUTINE TESTING W REFLEX): HIV Screen 4th Generation wRfx: NONREACTIVE

## 2018-03-31 LAB — LACTIC ACID, PLASMA
Lactic Acid, Venous: 2.8 mmol/L (ref 0.5–1.9)
Lactic Acid, Venous: 1.7 mmol/L (ref 0.5–1.9)

## 2018-03-31 LAB — TROPONIN I: Troponin I: 0.03 ng/mL (ref <0.03)

## 2018-03-31 MED ORDER — CHLORHEXIDINE GLUCONATE 0.12% ORAL RINSE (MEDLINE KIT)
15.0000 mL | Freq: Two times a day (BID) | OROMUCOSAL | Status: DC
  Administered 2018-03-31 – 2018-04-10 (×21): 15 mL via OROMUCOSAL

## 2018-03-31 MED ORDER — FUROSEMIDE 10 MG/ML IJ SOLN
INTRAMUSCULAR | Status: AC
  Administered 2018-03-31: 40 mg
  Filled 2018-03-31: qty 4

## 2018-03-31 MED ORDER — SODIUM CHLORIDE 0.9% FLUSH
10.0000 mL | Freq: Two times a day (BID) | INTRAVENOUS | Status: DC
  Administered 2018-03-31 – 2018-04-09 (×17): 10 mL
  Administered 2018-04-10: 20 mL

## 2018-03-31 MED ORDER — FUROSEMIDE 10 MG/ML IJ SOLN
40.0000 mg | Freq: Two times a day (BID) | INTRAMUSCULAR | Status: AC
  Administered 2018-03-31 – 2018-04-02 (×3): 40 mg via INTRAVENOUS
  Filled 2018-03-31 (×3): qty 4

## 2018-03-31 MED ORDER — HALOPERIDOL LACTATE 5 MG/ML IJ SOLN
2.0000 mg | Freq: Four times a day (QID) | INTRAMUSCULAR | Status: DC | PRN
  Filled 2018-03-31: qty 1

## 2018-03-31 MED ORDER — ACETAMINOPHEN 160 MG/5ML PO SOLN
650.0000 mg | Freq: Four times a day (QID) | ORAL | Status: DC | PRN
  Administered 2018-03-31 – 2018-04-10 (×5): 650 mg via ORAL
  Filled 2018-03-31 (×5): qty 20.3

## 2018-03-31 MED ORDER — FENTANYL BOLUS VIA INFUSION
50.0000 ug | INTRAVENOUS | Status: DC | PRN
  Administered 2018-03-31 – 2018-04-02 (×5): 50 ug via INTRAVENOUS
  Filled 2018-03-31: qty 50

## 2018-03-31 MED ORDER — SODIUM CHLORIDE 0.9% FLUSH: 10.0000 mL | INTRAVENOUS | Status: DC | PRN

## 2018-03-31 MED ORDER — DOCUSATE SODIUM 50 MG/5ML PO LIQD: 100.0000 mg | Freq: Two times a day (BID) | ORAL | Status: DC | PRN

## 2018-03-31 MED ORDER — FENTANYL CITRATE (PF) 100 MCG/2ML IJ SOLN
50.0000 ug | Freq: Once | INTRAMUSCULAR | Status: AC
  Administered 2018-03-31: 50 ug via INTRAVENOUS

## 2018-03-31 MED ORDER — FENTANYL 2500MCG IN NS 250ML (10MCG/ML) PREMIX INFUSION
25.0000 ug/h | INTRAVENOUS | Status: DC
  Administered 2018-03-31: 25 ug/h via INTRAVENOUS
  Filled 2018-03-31: qty 250

## 2018-03-31 MED ORDER — ORAL CARE MOUTH RINSE
15.0000 mL | OROMUCOSAL | Status: DC
  Administered 2018-03-31 – 2018-04-10 (×96): 15 mL via OROMUCOSAL

## 2018-03-31 MED ORDER — CHLORHEXIDINE GLUCONATE CLOTH 2 % EX PADS
6.0000 | MEDICATED_PAD | Freq: Every day | CUTANEOUS | Status: DC
  Administered 2018-04-01 – 2018-04-09 (×9): 6 via TOPICAL

## 2018-03-31 NOTE — Progress Notes (Signed)
Chaplain responded to Code Blue and offered emotional support and orientation to sister, whose husband arrived just as pt was being transferred to [facility].  Escorted family to [facility] waiting area and facilitated communication between medical staff and family.  Please contact if ongoing support is needed or requested.  Luana Shu [phone]    03/31/18 2100  Clinical Encounter Type  Visited With Family  Visit Type Code;Critical Care  Referral From Care management  Consult/Referral To Chaplain  Stress Factors  Patient Stress Factors Health changes  Family Stress Factors Lack of knowledge

## 2018-03-31 NOTE — Anesthesia Procedure Notes (Signed)
Procedure Name: Intubation Date/Time: 03/31/2018 2:34 PM Performed by: Suzy Bouchard, CRNA Pre-anesthesia Checklist: Emergency Drugs available, Suction available and Patient being monitored Patient Re-evaluated:Patient Re-evaluated prior to induction Oxygen Delivery Method: Ambu bag Preoxygenation: Pre-oxygenation with 100% oxygen Laryngoscope Size: Glidescope, 4, Miller and 2 Grade View: Grade II Tube type: Oral Tube size: 8.0 mm Number of attempts: 3 Airway Equipment and Method: Stylet and Video-laryngoscopy Placement Confirmation: ETT inserted through vocal cords under direct vision,  positive ETCO2 and breath sounds checked- equal and bilateral Secured at: 23 cm Tube secured with: Tape Comments: Responded to code blue.  RT present and ambu-bagging patient.  DL x1 with Glidescope 4, copious amounts of vomit in orophayrnx.  Suctioned,  DL x2 ETT to esophagus, immediately recognized and removed.  DL x3 with Mil 2, suctioned well again, +Placement confirmed with ETC02, EZ Cap and ascultation.  Ventilation taken over by RT.

## 2018-03-31 NOTE — Progress Notes (Signed)
Desats to 60's, unresponsive, with weak carotid pulse sister at bedside.  Called for code blue. ambu bagged. Started with compression, ,  Asystole  Epi given. Continued with resuscitation, intubated.  2:35 pm noted with pulse hr-50 , a fib.  Bp-214/100. Stopped resuscitation, transferred to ICU -[id number]. Report given to RN.Talked to sister and updated.

## 2018-03-31 NOTE — Significant Event (Signed)
Critical care attending:  Called to reassess patient after transfer to ICU following cardiorespiratory arrest. Was somnolent earlier on with some difficulty managing upper airway secretions.  Currently he is intubated and tolerating mechanical ventilation.  Beginning to make spontaneous movements.  His chest x-ray shows right middle and upper lobe infiltrates consistent with pneumonia  He is critically ill due to respiratory failure requiring mechanical ventilation. Plan is to continue full ventilatory support as well as antibiotic therapy.  We will continue diuresis as per tolerated by blood pressure. We will employ a strategy of IV fentanyl and haloperidol for sedation.  Critical care time: 35 min including chart data review, examination of patient, multidisciplinary rounds, and frequent assessment and modification of ventilator settings and iv sedation.  Kipp Brood, MD [facility] ICU Physician [place]  Pager: [phone] Mobile: [phone] After hours: [phone].  03/31/2018, 3:23 PM

## 2018-03-31 NOTE — Progress Notes (Signed)
RT NOTE: RT obtained ABG and reported critical value to Kipp Brood, MD. ABG as follows: pH 7.20, CO2 79.2, PO2 85 and HCO3 30.4.  Per MD increase RR to 20, wean FiO2 by sat and repeat ABG in two hours.   RT will continue to monitor.

## 2018-03-31 NOTE — Progress Notes (Signed)
Peripherally Inserted Central Catheter/Midline Placement  The IV Nurse has discussed with the patient and/or persons authorized to consent for the patient, the purpose of this procedure and the potential benefits and risks involved with this procedure.  The benefits include less needle sticks, lab draws from the catheter, and the patient may be discharged home with the catheter. Risks include, but not limited to, infection, bleeding, blood clot (thrombus formation), and puncture of an artery; nerve damage and irregular heartbeat and possibility to perform a PICC exchange if needed/ordered by physician.  Alternatives to this procedure were also discussed.  Bard Power PICC patient education guide, fact sheet on infection prevention and patient information card has been provided to patient /or left at bedside.    PICC/Midline Placement Documentation  PICC Double Lumen 44/96/75 PICC Right Basilic 42 cm 0 cm (Active)  Indication for Insertion or Continuance of Line Limited venous access - need for IV therapy >5 days (PICC only) 03/31/2018  5:21 PM  Exposed Catheter (cm) 0 cm 03/31/2018  5:21 PM  Site Assessment Clean;Dry;Intact 03/31/2018  5:21 PM  Lumen #1 Status Flushed;Blood return noted 03/31/2018  5:21 PM  Lumen #2 Status Flushed;Blood return noted 03/31/2018  5:21 PM  Dressing Type Transparent 03/31/2018  5:21 PM  Dressing Status Clean;Dry;Antimicrobial disc in place;Intact 03/31/2018  5:21 PM  Dressing Intervention New dressing 03/31/2018  5:21 PM  Dressing Change Due 04/07/18 03/31/2018  5:21 PM   Consent syigned by sister at bedside    Samuel Mccoy 03/31/2018, 5:23 PM

## 2018-03-31 NOTE — Progress Notes (Signed)
RT called for patient in cardiac arrest. ACLS protocol was in progress upon RT arrival. RT took over bagging the patient with an AMBU bag. Upon arrival, the CRNA intubated the patient. Upon return of ROSC, the patient was transferred to 2H16 and report was given to Alyson Reedy, RRT.

## 2018-03-31 NOTE — Progress Notes (Signed)
Messaged Dr Arnoldo Lenis re PICC order with fever noted per flowsheet and elevating WBC's.  States to proceed with PICC line.

## 2018-03-31 NOTE — H&P (Signed)
NAME:  Samuel Mccoy, MRN:  [id number], DOB:  November 13, 1950, LOS: 1 ADMISSION DATE:  04/08/2018, CONSULTATION DATE:  03/21/2018 REFERRING MD:  [place] ED, CHIEF COMPLAINT: Abdominal pain  HPI/course in hospital  68 year old man.  Poor historian.  Transferred from [facility] for potential paracentesis.  Presented there with generalized weakness, dyspnea worsened abdominal pain and distention and urinary incontinence which has been present for the last several days.  Denies any fever chills or nausea vomiting or diarrhea.  Reported their abdomen and legs are more swollen than normal.   No sick contacts.  Lives alone.  Schizophrenia and cirrhosis by history.  Initially attempted transfer to hospitalist service but because patient had been placed on BiPAP at [facility] he was refused by the hospitalist.  Review of investigations from [facility] show CT of the abdomen consistent with cirrhosis with a small amount of ascites..  Generalized edema.  No evidence of bowel obstruction. Chest x-ray shows small right pleural effusion and possible infiltrate.  Laboratory investigations show no leukocytosis.  Normal hemoglobin with low MCV MCH.  Normal platelet count..  Normal arterial blood gas..  Urinalysis unremarkable.  Normal electrolytes with normal creatinine.  Elevated proBNP at 1600.  AST ALT unremarkable.  Negative ammonia negative troponin.  Past Medical History   Past Medical History:  Diagnosis Date  . Atrial fibrillation    a. persistent fib/flutter; rate controlled. refuses ablation or DCCV  . Chronic anticoagulation    Xarelto  . Coronary atherosclerosis of unspecified type of vessel, native or graft   . Diabetes mellitus, type II   . Diabetic neuropathy   . Hyperlipidemia   . Hypertension   . Non compliance w medication regimen    Per his mother  . Obese   . Paranoid type delusional disorder   . Peripheral vascular disease   . Thrombocytopenia      Past  Surgical History:  Procedure Laterality Date  . APPENDECTOMY    . CARDIOVASCULAR STRESS TEST  July 2014  . LEFT HEART CATH  2000  . TONSILECTOMY, ADENOIDECTOMY, BILATERAL MYRINGOTOMY AND TUBES        Interim history/subjective:  More somnolent since given haloperidol for agitation last pm.  Objective   Blood pressure 130/74, pulse 94, temperature 99.5 F (37.5 C), temperature source Axillary, resp. rate (!) 23, height 6' (1.829 m), weight 120.3 kg, SpO2 95 %.        Intake/Output Summary (Last 24 hours) at 03/31/2018 1343 Last data filed at 03/31/2018 1200 Gross per 24 hour  Intake 250 ml  Output 500 ml  Net -250 ml   Filed Weights   03/29/2018 0704  Weight: 120.3 kg    Examination: Physical Exam  Constitutional: He appears well-developed and well-nourished. He appears listless.  HENT:  Head: Normocephalic and atraumatic.  Eyes: No scleral icterus.  Neck: Neck supple. No JVD present. No tracheal deviation present.  ++ upper airway secretions.  Cardiovascular: Regular rhythm and normal heart sounds.  Respiratory: Breath sounds normal.  GI: Soft. He exhibits no mass. There is hepatosplenomegaly. Tenderness: diffuse pain. There is no rigidity, no rebound, no guarding and no CVA tenderness. No hernia. Hernia confirmed negative in the right inguinal area and confirmed negative in the left inguinal area.  Stigmata of chronic liver disease.  Genitourinary:    Testes normal.  Right testis shows no tenderness. Left testis shows no tenderness.    Genitourinary Comments: Foley in place   Neurological: He has normal strength. He  appears listless. No cranial nerve deficit.  No asterixis present. Somnolent.  Skin: Skin is warm and dry.  +2 bilateral lower extremity edema with venous stasis changes.     Ancillary tests (personally reviewed)  CBC: Recent Labs  Lab 03/27/2018 1203  WBC 7.3  NEUTROABS 5.8  HGB 13.5  HCT 46.1  MCV 77.3*  PLT 071    Basic Metabolic Panel:  Recent Labs  Lab 03/22/2018 1203 03/31/18 0246  NA 134* 134*  K 4.0 4.6  CL 99 101  CO2 26 24  GLUCOSE 76 138*  BUN 14 19  CREATININE 0.91 1.00  CALCIUM 8.7* 8.2*   GFR: Estimated Creatinine Clearance: 97.3 mL/min (by C-G formula based on SCr of 1 mg/dL). Recent Labs  Lab 03/21/2018 1203  WBC 7.3  LATICACIDVEN 1.0    Liver Function Tests: Recent Labs  Lab 03/21/2018 1203 03/31/18 0246  AST 34 37  ALT 22 23  ALKPHOS 142* 140*  BILITOT 1.0 1.0  PROT 8.2* 8.0  ALBUMIN 3.8 3.5   No results for input(s): LIPASE, AMYLASE in the last 168 hours. No results for input(s): AMMONIA in the last 168 hours.  ABG No results found for: PHART, PCO2ART, PO2ART, HCO3, TCO2, ACIDBASEDEF, O2SAT   Coagulation Profile: Recent Labs  Lab 04/15/2018 1203  INR 1.2    Cardiac Enzymes: No results for input(s): CKTOTAL, CKMB, CKMBINDEX, TROPONINI in the last 168 hours.  HbA1C: No results found for: HGBA1C  CBG: Recent Labs  Lab 04/11/2018 1645 04/13/2018 2003 03/31/18 0004 03/31/18 0615 03/31/18 1041  GLUCAP 122* 104* 126* 139* 144*      Assessment & Plan:  Acute abdominal pain of unclear etiology.  His abdomen has been benign and symptoms appear to have resolved. Acute urinary incontinence.  It may be related to bladder distention and incomplete voiding even though creatinine is not yet elevated.  May account for his abdominal pain Acute confusional state.  Does not appear to have hepatic encephalopathy.  Likely related to schizophrenia, or delirium from intercurrent illness. Increasing edema with elevated proBNP Acute community-acquired pneumonia.  Likely the cause of his constellation of symptoms  Plan: Continue treatment for community-acquired pneumonia Monitor mental status.  Allow haloperidol to wear off.  If mental status does not improve may require intubation Reduced dose of as needed haloperidol Will check ABG Diuresis given elevated BNP and generalized edema Continue  Foley catheter to rule out obstructive uropathy Hold home oral medications until situation clarified. N.p.o. for now except for ice chips, IV maintenance fluids  Best practice:  Diet: N.p.o. Pain/Anxiety/Delirium protocol (if indicated): None VAP protocol (if indicated): Not indicated DVT prophylaxis: Subcu heparin GI prophylaxis: Not required Urinary catheter: Guide hemodynamic management and Possible urethral obstruction Glucose control: Monitor Mobility: Up ad lib. Code Status: Full Family Communication: No family present, will attempt to contact. Disposition: Stepdown.  Likely able to transition to floor tomorrow.   Critical care time: Not applicable.    Kipp Brood, MD [facility] ICU Physician [place]  Pager: [phone] Mobile: [phone] After hours: [phone].  03/31/2018, 1:43 PM

## 2018-03-31 NOTE — Progress Notes (Signed)
Pt admitted from [place] at 1500. Intubated, unresponsive. VSS. A fib noted on monitor. Dr. Lynetta Mare at bedside. Extensive discussion with family regarding goals of care. Pt noted to be exhibiting decorticate posturing, Dr. Lynetta Mare aware.   1725- Core temps remain >38. MD notified, PRN APAP orders given.

## 2018-04-01 ENCOUNTER — Encounter (HOSPITAL_COMMUNITY): Payer: Self-pay

## 2018-04-01 ENCOUNTER — Inpatient Hospital Stay (HOSPITAL_COMMUNITY): Payer: Medicare Other

## 2018-04-01 ENCOUNTER — Other Ambulatory Visit: Payer: Self-pay

## 2018-04-01 DIAGNOSIS — I4811 Longstanding persistent atrial fibrillation: Secondary | ICD-10-CM

## 2018-04-01 DIAGNOSIS — J9602 Acute respiratory failure with hypercapnia: Secondary | ICD-10-CM

## 2018-04-01 DIAGNOSIS — J9601 Acute respiratory failure with hypoxia: Principal | ICD-10-CM

## 2018-04-01 LAB — GLUCOSE, CAPILLARY
Glucose-Capillary: 115 mg/dL — ABNORMAL HIGH (ref 70–99)
Glucose-Capillary: 152 mg/dL — ABNORMAL HIGH (ref 70–99)
Glucose-Capillary: 165 mg/dL — ABNORMAL HIGH (ref 70–99)
Glucose-Capillary: 172 mg/dL — ABNORMAL HIGH (ref 70–99)

## 2018-04-01 LAB — CBC
HCT: 42.8 % (ref 39.0–52.0)
Hemoglobin: 12.3 g/dL — ABNORMAL LOW (ref 13.0–17.0)
MCH: 22.4 pg — ABNORMAL LOW (ref 26.0–34.0)
MCHC: 28.7 g/dL — ABNORMAL LOW (ref 30.0–36.0)
MCV: 78.1 fL — ABNORMAL LOW (ref 80.0–100.0)
Platelets: 123 10*3/uL — ABNORMAL LOW (ref 150–400)
RBC: 5.48 MIL/uL (ref 4.22–5.81)
RDW: 19.6 % — ABNORMAL HIGH (ref 11.5–15.5)
WBC: 7.6 10*3/uL (ref 4.0–10.5)
nRBC: 0 % (ref 0.0–0.2)

## 2018-04-01 LAB — BASIC METABOLIC PANEL
Anion gap: 7 (ref 5–15)
BUN: 35 mg/dL — ABNORMAL HIGH (ref 8–23)
CALCIUM: 8.1 mg/dL — AB (ref 8.9–10.3)
CO2: 26 mmol/L (ref 22–32)
Chloride: 106 mmol/L (ref 98–111)
Creatinine, Ser: 1.88 mg/dL — ABNORMAL HIGH (ref 0.61–1.24)
GFR calc Af Amer: 42 mL/min — ABNORMAL LOW (ref >60)
GFR calc non Af Amer: 36 mL/min — ABNORMAL LOW (ref >60)
Glucose, Bld: 124 mg/dL — ABNORMAL HIGH (ref 70–99)
Potassium: 4.1 mmol/L (ref 3.5–5.1)
Sodium: 139 mmol/L (ref 135–145)

## 2018-04-01 LAB — RESPIRATORY PANEL BY PCR
Adenovirus: NOT DETECTED
Bordetella pertussis: NOT DETECTED
CORONAVIRUS HKU1-RVPPCR: NOT DETECTED
Chlamydophila pneumoniae: NOT DETECTED
Coronavirus 229E: NOT DETECTED
Coronavirus NL63: NOT DETECTED
Coronavirus OC43: NOT DETECTED
Influenza A: NOT DETECTED
Influenza B: NOT DETECTED
Metapneumovirus: DETECTED — AB
Mycoplasma pneumoniae: NOT DETECTED
PARAINFLUENZA VIRUS 2-RVPPCR: NOT DETECTED
Parainfluenza Virus 1: NOT DETECTED
Parainfluenza Virus 3: NOT DETECTED
Parainfluenza Virus 4: NOT DETECTED
Respiratory Syncytial Virus: NOT DETECTED
Rhinovirus / Enterovirus: NOT DETECTED

## 2018-04-01 LAB — MAGNESIUM: Magnesium: 1.8 mg/dL (ref 1.7–2.4)

## 2018-04-01 MED ORDER — METOPROLOL TARTRATE 12.5 MG HALF TABLET
12.5000 mg | ORAL_TABLET | Freq: Two times a day (BID) | ORAL | Status: DC
  Administered 2018-04-01 – 2018-04-03 (×6): 12.5 mg via ORAL
  Filled 2018-04-01 (×6): qty 1

## 2018-04-01 MED ORDER — METOPROLOL TARTRATE 5 MG/5ML IV SOLN
INTRAVENOUS | Status: AC
  Filled 2018-04-01: qty 5

## 2018-04-01 MED ORDER — METOPROLOL TARTRATE 5 MG/5ML IV SOLN
5.0000 mg | Freq: Once | INTRAVENOUS | Status: AC
  Administered 2018-04-01: 5 mg via INTRAVENOUS

## 2018-04-01 MED ORDER — PANTOPRAZOLE SODIUM 40 MG PO PACK
40.0000 mg | PACK | Freq: Every day | ORAL | Status: DC
  Administered 2018-04-01 – 2018-04-09 (×9): 40 mg
  Filled 2018-04-01 (×9): qty 20

## 2018-04-01 NOTE — Consult Note (Signed)
Continue wound care measures for diabetic foot ulcer as established by Dr. Reinaldo Meeker in [place], [place].  While pt is hospitalized, Dr. Henrene Pastor recommends, per telephone conversation, to apply wet to moist dressings and change BID and as needed.   Orders: Wet to moist dressing.  Change BID and as needed.   Jacquelynn Cree. Erskine Emery, MSN student  Para March, MSN [place] RN

## 2018-04-01 NOTE — Progress Notes (Addendum)
NAME:  Samuel Mccoy, MRN:  [id number], DOB:  [birth year], LOS: 1 ADMISSION DATE:  04/03/2018, CONSULTATION DATE:  04/06/2018 REFERRING MD:  [facility] ED CHIEF COMPLAINT:  Abdominal pain  Brief History   69 year old male initially presented with weakness, dyspnea and abdominal [facility] to [facility]. Cardiac arrest on 3/15 requiring intubation.  HPI  68 year old male with schizophrenia and diabetes who presented to [place] on 3/13 for abdominal pain and hypoxemia. Admission labs with WBC 7.6 with N 72% and L 12% (abs 0.91). ABG 7.4/42/68. Trop 0.02. Influenza A/B negative. CT A/P with bilateral pleural effusions, small ascites but otherwise no evidence of acute abdominal abnormalities. On admission, he required 4L [place] however became hypoxemic on 3/14 requiring face mask and then BiPAP.   Sister provides additional history. Prior to admission, patient developed severe dyspnea. Unsure if he had fevers or chills. He has chronic abdominal pain that has been evaluated in the past. Denies hx of respiratory illness or recent travel. Denies direct sick contacts however expresses concern that she has taken her brother to several doctor visits in the in the last month related to his diabetic ulcer wound and has been exposed to waiting rooms with patients who had respiratory symptoms.  Past Medical History  Atrial fibrillation, chronic anticoagulation, CAD, DM2, HTN, HLD, schizophrenia  Significant Hospital Events   3/14 Transferred from [place] 3/15 Intubated  Consults:  PCCM  Procedures:  ETT 3/15>  Significant Diagnostic Tests:  CT A/P > Consistent with cirrhosis with a small amount of ascites. Bilateral pleural effusions. Generalized edema.  No evidence of bowel obstruction. TTE 3/14 > EF 60-65%. No WMA. Mild concentric LVH CXR 3/16 > Bilateral airspace disease R>L. Pulmonary congestion.  Micro Data:    Antimicrobials:  Ceftriaxone 3/14 > Azithro 3/14 >  Interim  history/subjective:  Intubated yesterday. Febrile overnight. Received diuresis.  Objective   Blood pressure 140/85, pulse (!) 105, temperature 99.9 F (37.7 C), resp. rate 20, height 6' (1.829 m), weight 121.3 kg, SpO2 97 %.    Vent Mode: PRVC FiO2 (%):  [60 %-100 %] 60 % Set Rate:  [15 bmp-20 bmp] 20 bmp Vt Set:  [620 mL] 620 mL PEEP:  [5 cmH20] 5 cmH20 Plateau Pressure:  [19 cmH20-33 cmH20] 24 cmH20   Intake/Output Summary (Last 24 hours) at 04/01/2018 0900 Last data filed at 04/01/2018 0700 Gross per 24 hour  Intake 1292.74 ml  Output 1700 ml  Net -407.26 ml   Filed Weights   04/16/2018 0704 04/01/18 0500  Weight: 120.3 kg 121.3 kg    Physical Exam: General: Sedated and on mechanical ventilation HENT: [place], AT, ETT in place Eyes: EOMI, no scleral icterus Respiratory: Rhonchi bilaterally. No wheezing Cardiovascular: RRR, -M/R/G, no JVD GI: BS+, soft, nontender, hepatosplenomegaly Extremities: 1+pitting edema in UE and LE, RLE wrapped Neuro: Sedated Skin: Intact, no rashes or bruising Psych: Unable to assess GU: Foley in place  Resolved Hospital Problem list     Assessment & Plan:  69 year old male with AF on Xarelto, DM, HTN and schizophrenia-paranoid type  S/p pulseless cardiac arrest Likely secondary to hypoxemia Plan: Supportive care as noted below  Acute hypoxemic and hypercarbic respiratory failure secondary to CAP, cardiac arrest, ?flash pulmonary edema  TTE normal. Evidence of volume overload on BNP and exam.  Plan: Full vent support Antibiotics Diuresis with net negative goal 1-2L ABG PRN RVP Sputum culture  Agitation requiring sedation Hx of schizophrenia. Reportedly agitated however  no activity this morning Plan: Fentanyl gtt. Wean as tolerated PRN Haldol Consider CT head if remains unresponsive  AKI Likely secondary ATN in setting of arrest and diuretic use Plan: Monitor Cr/UOP  Atrial fibrillation On diltiazem at home Plan: Hold  anticoagulation Start metoprolol 12.5 mg BID for now  DM2 RLE Diabetic Ulcer Plan: CBG q4 Wound care consult  Abdominal pain Cirrhosis on CT Unclear etiology. No evidence of ascites. OSH ammonia 9.0  Best practice:  Diet: NPO Pain/Anxiety/Delirium protocol (if indicated): Fentanyl VAP protocol (if indicated): Yes DVT prophylaxis: SQ heparin GI prophylaxis: PPI daily Glucose control: CBG q4h, SSI Mobility: BR Code Status: Full Family Communication: Sister  Disposition: Remain in ICU  Labs   CBC: Recent Labs  Lab 03/29/2018 1203 03/31/18 1544 03/31/18 1756 03/31/18 2022 04/01/18 0623  WBC 7.3 11.6*  --   --  7.6  NEUTROABS 5.8  --   --   --   --   HGB 13.5 12.8* 14.6 14.3 12.3*  HCT 46.1 46.6 43.0 42.0 42.8  MCV 77.3* 82.8  --   --  78.1*  PLT 161 149*  --   --  123*    Basic Metabolic Panel: Recent Labs  Lab 03/24/2018 1203 03/31/18 0246 03/31/18 1454 03/31/18 1756 03/31/18 2022 04/01/18 0623  NA 134* 134* 138 135 139 139  K 4.0 4.6 5.1 5.1 4.8 4.1  CL 99 101 108  --   --  106  CO2 26 24 21*  --   --  26  GLUCOSE 76 138* 146*  --   --  124*  BUN 14 19 25*  --   --  35*  CREATININE 0.91 1.00 1.34*  --   --  1.88*  CALCIUM 8.7* 8.2* 7.1*  --   --  8.1*  MG  --   --   --   --   --  1.8   GFR: Estimated Creatinine Clearance: 52 mL/min (A) (by C-G formula based on SCr of 1.88 mg/dL (H)). Recent Labs  Lab 03/24/2018 1203 03/31/18 1544 03/31/18 1548 03/31/18 1837 04/01/18 0623  WBC 7.3 11.6*  --   --  7.6  LATICACIDVEN 1.0  --  2.8* 1.7  --     Liver Function Tests: Recent Labs  Lab 04/16/2018 1203 03/31/18 0246  AST 34 37  ALT 22 23  ALKPHOS 142* 140*  BILITOT 1.0 1.0  PROT 8.2* 8.0  ALBUMIN 3.8 3.5   No results for input(s): LIPASE, AMYLASE in the last 168 hours. No results for input(s): AMMONIA in the last 168 hours.  ABG    Component Value Date/Time   PHART 7.307 (L) 03/31/2018 2022   PCO2ART 58.1 (H) 03/31/2018 2022   PO2ART 178.0 (H)  03/31/2018 2022   HCO3 28.7 (H) 03/31/2018 2022   TCO2 30 03/31/2018 2022   O2SAT 99.0 03/31/2018 2022     Coagulation Profile: Recent Labs  Lab 04/01/2018 1203  INR 1.2    Cardiac Enzymes: Recent Labs  Lab 03/31/18 1454  TROPONINI 0.03*    HbA1C: No results found for: HGBA1C  CBG: Recent Labs  Lab 03/31/18 0004 03/31/18 0615 03/31/18 1041 03/31/18 1559 04/01/18 0815  GLUCAP 126* 139* 144* 150* 115*     Critical care time: 40 min  The patient is critically ill with multiple organ systems failure and requires high complexity decision making for assessment and support, frequent evaluation and titration of therapies, application of advanced monitoring technologies and extensive interpretation of multiple  databases.   Rodman Pickle, M.D. [facility] Pulmonary/Critical Care Medicine Pager: [phone] After hours pager: [phone]

## 2018-04-02 ENCOUNTER — Inpatient Hospital Stay (HOSPITAL_COMMUNITY): Payer: Medicare Other

## 2018-04-02 DIAGNOSIS — G9340 Encephalopathy, unspecified: Secondary | ICD-10-CM

## 2018-04-02 DIAGNOSIS — J123 Human metapneumovirus pneumonia: Secondary | ICD-10-CM

## 2018-04-02 DIAGNOSIS — Z9911 Dependence on respirator [ventilator] status: Secondary | ICD-10-CM

## 2018-04-02 LAB — CBC
HEMATOCRIT: 41.8 % (ref 39.0–52.0)
Hemoglobin: 12.5 g/dL — ABNORMAL LOW (ref 13.0–17.0)
MCH: 22.6 pg — ABNORMAL LOW (ref 26.0–34.0)
MCHC: 29.9 g/dL — ABNORMAL LOW (ref 30.0–36.0)
MCV: 75.6 fL — ABNORMAL LOW (ref 80.0–100.0)
Platelets: 125 10*3/uL — ABNORMAL LOW (ref 150–400)
RBC: 5.53 MIL/uL (ref 4.22–5.81)
RDW: 19.7 % — AB (ref 11.5–15.5)
WBC: 5.3 10*3/uL (ref 4.0–10.5)
nRBC: 0 % (ref 0.0–0.2)

## 2018-04-02 LAB — POCT I-STAT 7, (LYTES, BLD GAS, ICA,H+H)
Acid-Base Excess: 5 mmol/L — ABNORMAL HIGH (ref 0.0–2.0)
Bicarbonate: 28.6 mmol/L — ABNORMAL HIGH (ref 20.0–28.0)
Calcium, Ion: 1.18 mmol/L (ref 1.15–1.40)
HCT: 41 % (ref 39.0–52.0)
Hemoglobin: 13.9 g/dL (ref 13.0–17.0)
O2 Saturation: 99 %
Patient temperature: 99.1
Potassium: 3.5 mmol/L (ref 3.5–5.1)
Sodium: 145 mmol/L (ref 135–145)
TCO2: 30 mmol/L (ref 22–32)
pCO2 arterial: 39.5 mmHg (ref 32.0–48.0)
pH, Arterial: 7.469 — ABNORMAL HIGH (ref 7.350–7.450)
pO2, Arterial: 126 mmHg — ABNORMAL HIGH (ref 83.0–108.0)

## 2018-04-02 LAB — BASIC METABOLIC PANEL
ANION GAP: 13 (ref 5–15)
BUN: 38 mg/dL — ABNORMAL HIGH (ref 8–23)
CO2: 25 mmol/L (ref 22–32)
Calcium: 8.4 mg/dL — ABNORMAL LOW (ref 8.9–10.3)
Chloride: 104 mmol/L (ref 98–111)
Creatinine, Ser: 2.09 mg/dL — ABNORMAL HIGH (ref 0.61–1.24)
GFR calc Af Amer: 37 mL/min — ABNORMAL LOW (ref >60)
GFR calc non Af Amer: 32 mL/min — ABNORMAL LOW (ref >60)
Glucose, Bld: 166 mg/dL — ABNORMAL HIGH (ref 70–99)
Potassium: 3.7 mmol/L (ref 3.5–5.1)
Sodium: 142 mmol/L (ref 135–145)

## 2018-04-02 LAB — GLUCOSE, CAPILLARY
Glucose-Capillary: 162 mg/dL — ABNORMAL HIGH (ref 70–99)
Glucose-Capillary: 155 mg/dL — ABNORMAL HIGH (ref 70–99)
Glucose-Capillary: 169 mg/dL — ABNORMAL HIGH (ref 70–99)
Glucose-Capillary: 181 mg/dL — ABNORMAL HIGH (ref 70–99)
Glucose-Capillary: 169 mg/dL — ABNORMAL HIGH (ref 70–99)
Glucose-Capillary: 153 mg/dL — ABNORMAL HIGH (ref 70–99)

## 2018-04-02 MED ORDER — FUROSEMIDE 10 MG/ML IJ SOLN
40.0000 mg | Freq: Every day | INTRAMUSCULAR | Status: DC
  Administered 2018-04-03 – 2018-04-05 (×2): 40 mg via INTRAVENOUS
  Filled 2018-04-02 (×3): qty 4

## 2018-04-02 MED ORDER — CLONIDINE HCL 0.1 MG PO TABS
0.1000 mg | ORAL_TABLET | Freq: Four times a day (QID) | ORAL | Status: DC | PRN
  Administered 2018-04-02: 0.1 mg
  Filled 2018-04-02: qty 1

## 2018-04-02 MED ORDER — EPINEPHRINE PF 1 MG/10ML IJ SOSY
PREFILLED_SYRINGE | INTRAMUSCULAR | Status: AC
  Filled 2018-04-02: qty 10

## 2018-04-02 MED ORDER — NYSTATIN 100000 UNIT/GM EX CREA
TOPICAL_CREAM | Freq: Two times a day (BID) | CUTANEOUS | Status: DC
  Administered 2018-04-02 (×2): via TOPICAL
  Administered 2018-04-03: 1 via TOPICAL
  Administered 2018-04-03 – 2018-04-05 (×5): via TOPICAL
  Administered 2018-04-06: 1 via TOPICAL
  Administered 2018-04-06 – 2018-04-07 (×2): via TOPICAL
  Administered 2018-04-07: 1 via TOPICAL
  Administered 2018-04-08 – 2018-04-09 (×3): via TOPICAL
  Administered 2018-04-09: 1 via TOPICAL
  Administered 2018-04-10: 10:00:00 via TOPICAL
  Filled 2018-04-02 (×3): qty 15

## 2018-04-02 MED ORDER — POTASSIUM CHLORIDE 20 MEQ/15ML (10%) PO SOLN
40.0000 meq | Freq: Every day | ORAL | Status: DC
  Administered 2018-04-03: 40 meq
  Filled 2018-04-02: qty 30

## 2018-04-02 MED ORDER — HYDRALAZINE HCL 20 MG/ML IJ SOLN
10.0000 mg | Freq: Four times a day (QID) | INTRAMUSCULAR | Status: DC | PRN
  Administered 2018-04-02 – 2018-04-04 (×6): 10 mg via INTRAVENOUS
  Filled 2018-04-02 (×6): qty 1

## 2018-04-02 NOTE — Procedures (Addendum)
History: 68 year old male being evaluated for encephalopathy  Sedation: None  Technique: This is a 21 channel routine scalp EEG performed at the bedside with bipolar and monopolar montages arranged in accordance to the international 10/20 system of electrode placement. One channel was dedicated to EKG recording.    Background: The background consists of generalized irregular delta and theta activities.  There is occasional frontally predominant intermittent rhythmic delta activity(FIRDA) without evolution.  Following stimulation, there becomes apparent generalized periodic discharges with a frequency of 2 Hz with bifrontal predominance and triphasic morphology.  There is no posterior dominant rhythm seen  Photic stimulation: Physiologic driving is not performed  EEG Abnormalities: 1) triphasic waves 2) generalized irregular slow activity  Clinical Interpretation: This EEG is consistent with a generalized nonspecific cerebral dysfunction (encephalopathy).  Though triphasic waves are seen in many encephalopathic states including hypoxic brain injury, they are most commonly associated with toxic metabolic etiologies.  There was no seizure or seizure predisposition recorded on this study. Please note that lack of epileptiform activity on EEG does not preclude the possibility of epilepsy.   Roland Rack, MD [facility] [phone]  If 7pm- 7am, please page neurology on call as listed in [place].

## 2018-04-02 NOTE — Progress Notes (Signed)
eLink Physician-Brief Progress Note Patient Name: Samuel Mccoy DOB: March 22, 1950 MRN: [id number]   Date of Service  04/02/2018  HPI/Events of Note  Hypertension - BP = 160/93  eICU Interventions  Will order: 1. Catapres 0.1 mg per tube Q 6 hours PRN SBP > 170 or DBP > 100.      Intervention Category Major Interventions: Hypertension - evaluation and management  Robyne Matar [place] 04/02/2018, 4:02 AM

## 2018-04-02 NOTE — Progress Notes (Signed)
EEG completed, results pending. 

## 2018-04-02 NOTE — Progress Notes (Signed)
Wasted 50 mls of fentanyl with Katherina Right, at [phone].

## 2018-04-02 NOTE — Progress Notes (Signed)
NAME:  Samuel Mccoy, MRN:  [id number], DOB:  [birth year], LOS: 2 ADMISSION DATE:  03/21/2018, CONSULTATION DATE:  03/29/2018 REFERRING MD:  Colin Rhein [facility] ED CHIEF COMPLAINT:  Abdominal pain  Brief History   68 year old male initially presented with weakness, dyspnea and abdominal pain to [facility]. Cardiac arrest on 3/15 requiring intubation.  HPI  68 year old male with schizophrenia and diabetes who presented to [place] on 3/13 for abdominal pain and hypoxemia. Admission labs with WBC 7.6 with N 72% and L 12% (abs 0.91). ABG 7.4/42/68. Trop 0.02. Influenza A/B negative. CT A/P with bilateral pleural effusions, small ascites but otherwise no evidence of acute abdominal abnormalities. On admission, he required 4L [place] however became hypoxemic on 3/14 requiring face mask and then BiPAP.   Sister provides additional history. Prior to admission, patient developed severe dyspnea. Unsure if he had fevers or chills. He has chronic abdominal pain that has been evaluated in the past. Denies hx of respiratory illness or recent travel. Denies direct sick contacts however expresses concern that she has taken her brother to several doctor visits in the in the last month related to his diabetic ulcer wound and has been exposed to waiting rooms with patients who had respiratory symptoms.  Past Medical History  Atrial fibrillation, chronic anticoagulation, CAD, DM2, HTN, HLD, schizophrenia  Significant Hospital Events   3/14 Transferred from [place] 3/15 Intubated  Consults:  PCCM  Procedures:  ETT 3/15>  Significant Diagnostic Tests:  CT A/P > Consistent with cirrhosis with a small amount of ascites. Bilateral pleural effusions. Generalized edema.  No evidence of bowel obstruction. TTE 3/14 > EF 60-65%. No WMA. Mild concentric LVH CXR 3/16 > Bilateral airspace disease R>L. Pulmonary congestion. CT head 3/16 > no acute process. EEG 3/17 > no seizure activity.  Generalized encephalopathy.  Micro Data:    Antimicrobials:  Ceftriaxone 3/14 > Azithro 3/14 >  Interim history/subjective:  No acute events. Remains fully unresponsive.  Objective   Blood pressure 136/81, pulse (!) 105, temperature 99 F (37.2 C), resp. rate 20, height 6' (1.829 m), weight 118.9 kg, SpO2 92 %. CVP:  [16 mmHg] 16 mmHg  Vent Mode: PRVC FiO2 (%):  [50 %-60 %] 50 % Set Rate:  [20 bmp] 20 bmp Vt Set:  [782 mL] 620 mL PEEP:  [5 cmH20] 5 cmH20 Plateau Pressure:  [21 cmH20-27 cmH20] 23 cmH20   Intake/Output Summary (Last 24 hours) at 04/02/2018 1503 Last data filed at 04/02/2018 1400 Gross per 24 hour  Intake 1501.5 ml  Output 2845 ml  Net -1343.5 ml   Filed Weights   03/25/2018 0704 04/01/18 0500 04/02/18 0500  Weight: 120.3 kg 121.3 kg 118.9 kg    Physical Exam: General: Sedated and on mechanical ventilation, in NAD HENT: [place], AT, ETT in place Eyes: Intermittently opens eyes although no purposeful movement or tracking, no scleral icterus Respiratory: CTAB Cardiovascular: RRR, -M/R/G, no JVD GI: BS+, soft, nontender, hepatosplenomegaly Extremities: No deformities, no edema Neuro: Does not follow commands, fully unresponsive, no sedation Skin: Intact, no rashes or bruising   Assessment & Plan:   S/p pulseless cardiac arrest - Likely secondary to hypoxemia Plan: Supportive care as noted below  Acute hypoxemic and hypercarbic respiratory failure secondary to CAP, metapneumovirus, cardiac arrest, ?flash pulmonary edema  TTE normal. Evidence of volume overload on BNP and exam.  Plan: Full vent support No weaning given depressed mental status and unresponsive state Continue empiric CAP coverage Continue diuresis, drop  from BID to daily ABG and CXR intermittently  AMS - unclear etiology.  Head CT and EEG negative. Plan: Consider MRI brain  Agitation requiring sedation - resolved and now off sedation Hx of schizophrenia. Reportedly agitated on admit; however no activity this  morning Plan: D/c fentanyl gtt and hold all sedation as able  AKI - Likely secondary ATN in setting of arrest and diuretic use. Plan: Continue diuresis, drop from BID to daily Monitor Cr/UOP  Atrial fibrillation On diltiazem at home Plan: Hold anticoagulation Start metoprolol 12.5 mg BID for now Hold preadmission diltiazem  HTN. Plan: Continue lopressor, hydralazine PRN  DM2 RLE Diabetic Ulcer Plan: CBG q4 Wound care consult  Abdominal pain. Cirrhosis on CT - Unclear etiology. No evidence of ascites on [place]. OSH ammonia 9.0 - Monitor clinically  Best practice:  Diet: NPO Pain/Anxiety/Delirium protocol (if indicated): Fentanyl PRN only VAP protocol (if indicated): Yes DVT prophylaxis: SQ heparin GI prophylaxis: PPI daily Glucose control: CBG q4h, SSI Mobility: BR Code Status: Full Family Communication: None available 3/17 Disposition: Remain in ICU   CC time: 40 min.   Montey Hora, [facility] Pager: [phone].  If no answer, [phone] 04/02/2018, 3:13 PM

## 2018-04-02 NOTE — Progress Notes (Signed)
Patient transported to MRI at 22:10 and back to 9T91 without complications.

## 2018-04-02 NOTE — Progress Notes (Signed)
Pt still unresponsive this shift. Not on any sedation. Family called and updated this shift. Pt now has PRN hydralazine for BP. Family meeting to be held in the morning for discussion of care. Pt had great urine output this shift. EEG done this shift. MRI to be done.  No new changes at this time. Will continue to monitor.

## 2018-04-03 ENCOUNTER — Inpatient Hospital Stay (HOSPITAL_COMMUNITY): Payer: Medicare Other

## 2018-04-03 DIAGNOSIS — G9341 Metabolic encephalopathy: Secondary | ICD-10-CM

## 2018-04-03 DIAGNOSIS — K7460 Unspecified cirrhosis of liver: Secondary | ICD-10-CM

## 2018-04-03 DIAGNOSIS — R0689 Other abnormalities of breathing: Secondary | ICD-10-CM

## 2018-04-03 LAB — TSH: TSH: 1.046 u[IU]/mL (ref 0.350–4.500)

## 2018-04-03 LAB — BASIC METABOLIC PANEL
Anion gap: 11 (ref 5–15)
BUN: 36 mg/dL — ABNORMAL HIGH (ref 8–23)
CO2: 29 mmol/L (ref 22–32)
Calcium: 8.8 mg/dL — ABNORMAL LOW (ref 8.9–10.3)
Chloride: 106 mmol/L (ref 98–111)
Creatinine, Ser: 1.82 mg/dL — ABNORMAL HIGH (ref 0.61–1.24)
GFR calc Af Amer: 44 mL/min — ABNORMAL LOW (ref >60)
GFR calc non Af Amer: 38 mL/min — ABNORMAL LOW (ref >60)
Glucose, Bld: 183 mg/dL — ABNORMAL HIGH (ref 70–99)
Potassium: 3.2 mmol/L — ABNORMAL LOW (ref 3.5–5.1)
Sodium: 146 mmol/L — ABNORMAL HIGH (ref 135–145)

## 2018-04-03 LAB — POCT I-STAT 7, (LYTES, BLD GAS, ICA,H+H)
Acid-Base Excess: 7 mmol/L — ABNORMAL HIGH (ref 0.0–2.0)
Bicarbonate: 30.7 mmol/L — ABNORMAL HIGH (ref 20.0–28.0)
Calcium, Ion: 1.23 mmol/L (ref 1.15–1.40)
HCT: 42 % (ref 39.0–52.0)
Hemoglobin: 14.3 g/dL (ref 13.0–17.0)
O2 Saturation: 99 %
Potassium: 3.1 mmol/L — ABNORMAL LOW (ref 3.5–5.1)
Sodium: 145 mmol/L (ref 135–145)
TCO2: 32 mmol/L (ref 22–32)
pCO2 arterial: 39.9 mmHg (ref 32.0–48.0)
pH, Arterial: 7.495 — ABNORMAL HIGH (ref 7.350–7.450)
pO2, Arterial: 114 mmHg — ABNORMAL HIGH (ref 83.0–108.0)

## 2018-04-03 LAB — MAGNESIUM
Magnesium: 1.8 mg/dL (ref 1.7–2.4)
Magnesium: 1.6 mg/dL — ABNORMAL LOW (ref 1.7–2.4)
Magnesium: 1.5 mg/dL — ABNORMAL LOW (ref 1.7–2.4)

## 2018-04-03 LAB — PHOSPHORUS
Phosphorus: 2.2 mg/dL — ABNORMAL LOW (ref 2.5–4.6)
Phosphorus: 2.1 mg/dL — ABNORMAL LOW (ref 2.5–4.6)
Phosphorus: 1.7 mg/dL — ABNORMAL LOW (ref 2.5–4.6)

## 2018-04-03 LAB — CBC
HCT: 42.6 % (ref 39.0–52.0)
Hemoglobin: 12.8 g/dL — ABNORMAL LOW (ref 13.0–17.0)
MCH: 22.3 pg — AB (ref 26.0–34.0)
MCHC: 30 g/dL (ref 30.0–36.0)
MCV: 74.2 fL — AB (ref 80.0–100.0)
Platelets: 129 10*3/uL — ABNORMAL LOW (ref 150–400)
RBC: 5.74 MIL/uL (ref 4.22–5.81)
RDW: 20 % — ABNORMAL HIGH (ref 11.5–15.5)
WBC: 6.2 10*3/uL (ref 4.0–10.5)
nRBC: 0 % (ref 0.0–0.2)

## 2018-04-03 LAB — GLUCOSE, CAPILLARY
GLUCOSE-CAPILLARY: 199 mg/dL — AB (ref 70–99)
GLUCOSE-CAPILLARY: 203 mg/dL — AB (ref 70–99)
Glucose-Capillary: 194 mg/dL — ABNORMAL HIGH (ref 70–99)
Glucose-Capillary: 184 mg/dL — ABNORMAL HIGH (ref 70–99)
Glucose-Capillary: 211 mg/dL — ABNORMAL HIGH (ref 70–99)

## 2018-04-03 LAB — AMMONIA: Ammonia: 22 umol/L (ref 9–35)

## 2018-04-03 MED ORDER — LACTULOSE 10 GM/15ML PO SOLN
10.0000 g | Freq: Three times a day (TID) | ORAL | Status: DC
  Administered 2018-04-03 – 2018-04-07 (×11): 10 g via ORAL
  Filled 2018-04-03 (×11): qty 15

## 2018-04-03 MED ORDER — PRO-STAT SUGAR FREE PO LIQD
30.0000 mL | Freq: Four times a day (QID) | ORAL | Status: DC
  Administered 2018-04-03 – 2018-04-09 (×27): 30 mL
  Filled 2018-04-03 (×27): qty 30

## 2018-04-03 MED ORDER — VITAL HIGH PROTEIN PO LIQD
1000.0000 mL | ORAL | Status: DC
  Administered 2018-04-03 – 2018-04-10 (×6): 1000 mL

## 2018-04-03 MED ORDER — SODIUM CHLORIDE 0.9 % IV SOLN
INTRAVENOUS | Status: DC
  Administered 2018-04-03 – 2018-04-09 (×2): via INTRAVENOUS

## 2018-04-03 MED ORDER — INSULIN ASPART 100 UNIT/ML ~~LOC~~ SOLN
1.0000 [IU] | SUBCUTANEOUS | Status: DC
  Administered 2018-04-03 – 2018-04-04 (×5): 3 [IU] via SUBCUTANEOUS

## 2018-04-03 MED ORDER — POTASSIUM CHLORIDE 10 MEQ/50ML IV SOLN
10.0000 meq | INTRAVENOUS | Status: AC
  Administered 2018-04-03 (×4): 10 meq via INTRAVENOUS
  Filled 2018-04-03 (×4): qty 50

## 2018-04-03 NOTE — Consult Note (Addendum)
Neurology Consultation  Reason for Consult: Altered mental status/encephalopathy/prognostication Referring Physician: Loanne Drilling   History is obtained from: Chart  HPI: Samuel Mccoy is a 68 y.o. male with history of thrombocytopenia, peripheral vascular disease, paranoid type delusional disorder, hypertension, hyperlipidemia, diabetic neuropathy, coronary atherosclerosis, atrial fibrillation.  Patient was transferred from [facility] initially for potential paracentesis.  Patient presented to [facility] with generalized weakness, dyspnea, worsened abdominal pain and distention and distention along with urinary incontinence.  Due to arriving at hospital on BiPAP he was refused and sent to [facility].  On 03/31/2018 patient had desaturation to the 60s, unresponsive, weak carotid pulse.  Patient then went into asystole.  CODE BLUE was called.  Downtime per nurse was approximately 6 minutes.  Per notes on 03/31/2018 patient was intubated unresponsive and at that time showing decorticate posturing.  EEG was obtained and showed triphasic waves-generalized irregular slow activity consistent with nonspecific cerebral dysfunction- encephalopathy.  MRI brain was obtained on 04/02/2018.  No acute abnormality was noted.  Neurology was asked to evaluate patient secondary to patient's nonresponsiveness off sedation.  She has been off sedation (fentanyl) over 24 hours.    ROS: Unable to obtain due to altered mental status.   Past Medical History:  Diagnosis Date  . Atrial fibrillation ([place])    a. persistent fib/flutter; rate controlled. refuses ablation or DCCV  . Chronic anticoagulation    Xarelto  . Coronary atherosclerosis of unspecified type of vessel, native or graft   . Diabetes mellitus, type II ([place])   . Diabetic neuropathy ([place])   . Hyperlipidemia   . Hypertension   . Non compliance w medication regimen    Per his mother  . Obese   . Paranoid type delusional disorder ([place])   .  Peripheral vascular disease ([place])   . Thrombocytopenia ([place])     Family History  Problem Relation Age of Onset  . Diabetes Father   . Heart disease Father        Heart Disease before age 22  . Cancer Father   . Hyperlipidemia Father   . Hypertension Father   . Heart attack Father   . Hypertension Mother   . Cancer Mother   . Deep vein thrombosis Mother     Social History:   reports that he has never smoked. He has never used smokeless tobacco. He reports that he does not drink alcohol or use drugs.  Medications  Current Facility-Administered Medications:  .  acetaminophen (TYLENOL) solution 650 mg, 650 mg, Oral, Q6H PRN, Kipp Brood, MD, 650 mg at 04/01/18 0801 .  albumin human 25 % solution 50 g, 50 g, Intravenous, Once, Kipp Brood, MD, Stopped at 04/08/2018 1333 .  azithromycin (ZITHROMAX) 500 mg in sodium chloride 0.9 % 250 mL IVPB, 500 mg, Intravenous, Q24H, Erick Colace, NP, Stopped at 04/02/18 1822 .  cefTRIAXone (ROCEPHIN) 2 g in sodium chloride 0.9 % 100 mL IVPB, 2 g, Intravenous, Q24H, Kris Mouton, [facility], Stopped at 04/02/18 1714 .  chlorhexidine gluconate (MEDLINE KIT) (PERIDEX) 0.12 % solution 15 mL, 15 mL, Mouth Rinse, BID, Agarwala, Ravi, MD, 15 mL at 04/03/18 0821 .  Chlorhexidine Gluconate Cloth 2 % PADS 6 each, 6 each, Topical, Daily, Kipp Brood, MD, 6 each at 04/02/18 0848 .  dextrose 5 %-0.9 % sodium chloride infusion, , Intravenous, Continuous, Erick Colace, NP, Last Rate: 50 mL/hr at 04/03/18 0918 .  docusate (COLACE) 50 MG/5ML liquid 100 mg, 100 mg, Per Tube, BID PRN, Agarwala,  Einar Grad, MD .  fentaNYL (SUBLIMAZE) bolus via infusion 50 mcg, 50 mcg, Intravenous, Q1H PRN, Kipp Brood, MD, 50 mcg at 04/02/18 0335 .  furosemide (LASIX) injection 40 mg, 40 mg, Intravenous, Daily, Desai, Rahul P, PA-C, 40 mg at 04/03/18 6606 .  haloperidol lactate (HALDOL) injection 2 mg, 2 mg, Intravenous, Q6H PRN, Agarwala, Ravi, MD .  heparin injection 5,000  Units, 5,000 Units, Subcutaneous, Q8H, Kipp Brood, MD, 5,000 Units at 04/03/18 0659 .  hydrALAZINE (APRESOLINE) injection 10 mg, 10 mg, Intravenous, Q6H PRN, Margaretha Seeds, MD, 10 mg at 04/03/18 0204 .  hydrALAZINE (APRESOLINE) tablet 10 mg, 10 mg, Oral, Q8H, Erick Colace, NP, 10 mg at 04/03/18 0659 .  lactulose (CHRONULAC) 10 GM/15ML solution 10 g, 10 g, Oral, TID, Margaretha Seeds, MD .  MEDLINE mouth rinse, 15 mL, Mouth Rinse, 10 times per day, Kipp Brood, MD, 15 mL at 04/03/18 0650 .  metoprolol tartrate (LOPRESSOR) tablet 12.5 mg, 12.5 mg, Oral, BID, Margaretha Seeds, MD, 12.5 mg at 04/03/18 3016 .  nystatin cream (MYCOSTATIN), , Topical, BID, Margaretha Seeds, MD .  ondansetron [facility]) injection 4 mg, 4 mg, Intravenous, Q6H PRN, Kipp Brood, MD, 4 mg at 04/02/2018 1821 .  pantoprazole sodium (PROTONIX) 40 mg/20 mL oral suspension 40 mg, 40 mg, Per Tube, Daily, Margaretha Seeds, MD, 40 mg at 04/03/18 0109 .  potassium chloride 20 MEQ/15ML (10%) solution 40 mEq, 40 mEq, Per Tube, Daily, Desai, Rahul P, PA-C, 40 mEq at 04/03/18 3235 .  sodium chloride flush (NS) 0.9 % injection 10-40 mL, 10-40 mL, Intracatheter, Q12H, Agarwala, Ravi, MD, 10 mL at 04/02/18 2100 .  sodium chloride flush (NS) 0.9 % injection 10-40 mL, 10-40 mL, Intracatheter, PRN, Kipp Brood, MD   Exam: Current vital signs: BP (!) 159/88   Pulse 99   Temp 98.1 F (36.7 C) (Oral)   Resp 20   Ht 6' (1.829 m)   Wt 118.9 kg   SpO2 92%   BMI 35.55 kg/m  Vital signs in last 24 hours: Temp:  [98 F (36.7 C)-99.7 F (37.6 C)] 98.1 F (36.7 C) (03/18 0729) Pulse Rate:  [86-122] 99 (03/18 1000) Resp:  [20] 20 (03/18 1000) BP: (128-196)/(71-113) 159/88 (03/18 1000) SpO2:  [88 %-99 %] 92 % (03/18 1000) FiO2 (%):  [40 %-50 %] 40 % (03/18 0800)  Physical Exam  Constitutional: Appears well-developed and well-nourished.  Psych: Affect appropriate to situation Eyes: No scleral injection HENT: No OP  obstrucion Head: Normocephalic.  Cardiovascular: Normal rate and regular rhythm.  Respiratory: Effort normal, non-labored breathing GI: Soft.  No distension. There is no tenderness.  Skin: WDI  Neuro: Mental Status: Patient does not respond to verbal stimuli.  Does not respond to deep sternal rub.  Does not follow commands.  No verbalizations noted.  Patient is not breathing over the vent  and becomes apneic when turned. Cranial Nerves: II: patient does not respond confrontation bilaterally,  III,IV,VI: doll's response present bilaterally. pupils right 2 mm, left 2 mm,and reactive bilaterally V,VII: corneal reflex present bilaterally  VIII: patient does not respond to verbal stimuli IX,X: gag reflex present, XI: trapezius strength unable to test bilaterally XII: tongue strength unable to test Motor: Extremities flaccid throughout.  No spontaneous movement noted.  No purposeful movements noted.  Even with noxious stimuli Sensory: Does not respond to noxious stimuli in any extremity. Deep Tendon Reflexes:  Absent throughout. Plantars: absent bilaterally Cerebellar: Unable to perform  Labs I have  reviewed labs in epic and the results pertinent to this consultation are:   CBC    Component Value Date/Time   WBC 6.2 04/03/2018 0304   RBC 5.74 04/03/2018 0304   HGB 14.3 04/03/2018 0347   HCT 42.0 04/03/2018 0347   PLT 129 (L) 04/03/2018 0304   MCV 74.2 (L) 04/03/2018 0304   MCH 22.3 (L) 04/03/2018 0304   MCHC 30.0 04/03/2018 0304   RDW 20.0 (H) 04/03/2018 0304   LYMPHSABS 0.5 (L) 03/29/2018 1203   MONOABS 1.0 04/05/2018 1203   EOSABS 0.0 04/16/2018 1203   BASOSABS 0.1 03/27/2018 1203    CMP     Component Value Date/Time   NA 145 04/03/2018 0347   K 3.1 (L) 04/03/2018 0347   CL 106 04/03/2018 0304   CO2 29 04/03/2018 0304   GLUCOSE 183 (H) 04/03/2018 0304   BUN 36 (H) 04/03/2018 0304   CREATININE 1.82 (H) 04/03/2018 0304   CREATININE 0.91 11/11/2013 1031   CALCIUM  8.8 (L) 04/03/2018 0304   PROT 8.0 03/31/2018 0246   ALBUMIN 3.5 03/31/2018 0246   AST 37 03/31/2018 0246   ALT 23 03/31/2018 0246   ALKPHOS 140 (H) 03/31/2018 0246   BILITOT 1.0 03/31/2018 0246   GFRNONAA 38 (L) 04/03/2018 0304   GFRAA 44 (L) 04/03/2018 0304    Lipid Panel     Component Value Date/Time   CHOL 146 07/02/2013 0415   TRIG 305 (H) 07/02/2013 0415   HDL 36 (L) 07/02/2013 0415   CHOLHDL 4.1 07/02/2013 0415   VLDL 61 (H) 07/02/2013 0415   LDLCALC 49 07/02/2013 0415     Imaging I have reviewed the images obtained:  CT-scan of the brain- no acute intracranial pathology.  Unchanged mild small vessel white matter disease.  Imaging obtained on 04/01/2018  MRI examination of the brain- obtained 04/02/2018 20-3 days after cardiac arrest- no acute abnormality.  EEG-showed triphasic waves-generalized irregular slow activity consistent with nonspecific cerebral dysfunction- encephalopathy.  Etta Quill PA-C [facility] [phone]  M-F  (9:00 am- 5:00 PM)  04/03/2018, 10:23 AM  I have seen the patient and reviewed the above note.   Assessment:  68 year old male initially presenting for abdominal pain and shortness of breath then suffering cardiac arrest 3/15 with downtime approximately 6 minutes.  Postarrest patient not responding to external stimuli and continues to not respond with sedation shut off for greater than 24 hours.   With normal MRI and intact brainstem, I do not think that we can rule out the possibility that he may have some improvement over time.  Also, with his history of cirrhosis and the triphasics that were seen on EEG, I would favor treating aggressively for hepatic encephalopathy.  Ceftriaxone can be associated with encephalopathy, bolus, and then ceftaz edema cefepime, if he does not improve with treatment for hepatic encephalopathy I think that changing from a cephalosporin to another agent would be indicated.  Recommendations: 1)  consider non-cephalosporin options as opposed to ceftriaxone 2) treat for hepatic encephalopathy 3) continue supportive care from a neurological perspective. 4) TSH, ammonia 5) neurology will continue to follow  Roland Rack, MD [facility] [phone]  If 7pm- 7am, please page neurology on call as listed in [place].

## 2018-04-03 NOTE — Progress Notes (Signed)
Initial Nutrition Assessment  DOCUMENTATION CODES:   Obesity unspecified  INTERVENTION:   Tube Feeding:  Vital High Protein at 50 ml/hr Pro-Stat 30 mL QID Provides 166 g of protein, 1600 kcals, 1008 mL of free water Meets 100% of calorie and protein needs  NUTRITION DIAGNOSIS:   Inadequate oral intake related to acute illness as evidenced by NPO status.  GOAL:   Provide needs based on ASPEN/SCCM guidelines  MONITOR:   TF tolerance, Vent status, Labs, Weight trends  REASON FOR ASSESSMENT:   Ventilator    ASSESSMENT:    68 yo male admitted on 3/15 post cardiac arrest requiring intubation, acute respiratory failure with RVP + Metanpneumovirus. Not testing for COVID-19. PMH includes a.fib, CAD, DM, HTN, HLD, schizophrenia, hx of chronic abdominal pain.    3/13 CT abdomen/pelvis consistent with cirrhosis with small amount of ascites 3/17 EEG no seizures  Patient is currently intubated on ventilator support MV: 11.5 L/min Temp (24hrs), Avg:98.7 F (37.1 C), Min:98 F (36.7 C), Max:99.1 F (37.3 C)  No family at bedside; unable to obtain diet and weight history at this time  Current wt 118.9 kg; down from admission wt of 120.3 kg. Net negative 5 L per I/O flowsheet  Labs: sodium 146, potassium 3.1 (L), phosphorus 2.2 (L).  Meds: D5-NS at 50 ml/hr, lasix x 4 doses, KCl  NUTRITION - FOCUSED PHYSICAL EXAM:  Unable to perform at this time  Diet Order:   Diet Order    None      EDUCATION NEEDS:   Not appropriate for education at this time  Skin:  Skin Assessment: Skin Integrity Issues: Skin Integrity Issues:: Diabetic Ulcer Diabetic Ulcer: right lateral foot  Last BM:  3/13  Height:   Ht Readings from Last 1 Encounters:  03/18/2018 6' (1.829 m)    Weight:   Wt Readings from Last 1 Encounters:  04/02/18 118.9 kg    Ideal Body Weight:  80.9 kg  BMI:  Body mass index is 35.55 kg/m.  Estimated Nutritional Needs:   Kcal:  5498-2641 kcals    Protein:  161-202 g  Fluid:  >/= 1.6 L   Kerman Passey MS, RD, LDN, CNSC [phone] Pager  [phone] Weekend/On-Call Pager

## 2018-04-03 NOTE — Progress Notes (Signed)
[facility] Progress Note Patient Name: Samuel Mccoy DOB: 05-Nov-1950 MRN: [id number]   Date of Service  04/03/2018  HPI/Events of Note  K+ = 3.1 and Creatinine = 1.82.   eICU Interventions  Will replace K+ cautiously.       Intervention Category Major Interventions: Electrolyte abnormality - evaluation and management  Sommer,Steven Eugene 04/03/2018, 5:03 AM

## 2018-04-03 NOTE — Progress Notes (Signed)
NAME:  Samuel Mccoy, MRN:  [id number], DOB:  09/23/1950, LOS: 3 ADMISSION DATE:  04/08/2018, CONSULTATION DATE:  03/29/2018 REFERRING MD:  Colin Rhein [facility] ED CHIEF COMPLAINT:  Abdominal pain  Brief History   68 year old male initially presented with weakness, dyspnea and abdominal pain to [facility]. Cardiac arrest on 3/15 requiring intubation.  HPI  68 year old male with schizophrenia and diabetes who presented to [place] on 3/13 for abdominal pain and hypoxemia. Admission labs with WBC 7.6 with N 72% and L 12% (abs 0.91). ABG 7.4/42/68. Trop 0.02. Influenza A/B negative. CT A/P with bilateral pleural effusions, small ascites but otherwise no evidence of acute abdominal abnormalities. On admission, he required 4L [place] however became hypoxemic on 3/14 requiring face mask and then BiPAP.   Sister provides additional history. Prior to admission, patient developed severe dyspnea. Unsure if he had fevers or chills. He has chronic abdominal pain that has been evaluated in the past. Denies hx of respiratory illness or recent travel. Denies direct sick contacts however expresses concern that she has taken her brother to several doctor visits in the in the last month related to his diabetic ulcer wound and has been exposed to waiting rooms with patients who had respiratory symptoms.  Past Medical History  Atrial fibrillation, chronic anticoagulation, CAD, DM2, HTN, HLD, schizophrenia  Significant Hospital Events   3/14 Transferred from [place] 3/15 Intubated  Consults:  PCCM  Procedures:  ETT 3/15>  Significant Diagnostic Tests:  CT A/P > Consistent with cirrhosis with a small amount of ascites. Bilateral pleural effusions. Generalized edema.  No evidence of bowel obstruction. TTE 3/14 > EF 60-65%. No WMA. Mild concentric LVH CXR 3/16 > Bilateral airspace disease R>L. Pulmonary congestion. CT head 3/16 > no acute process. EEG 3/17 > no seizure activity.  Generalized encephalopathy.  Micro Data:    Antimicrobials:  Ceftriaxone 3/14 > Azithro 3/14 >  Interim history/subjective:  Off sedation. Remains unresponsive. Family meeting held this morning. Sister and brother expresses concern for COVID-19. We discussed patient's clinical course and rapid respiratory decline c/b cardiac arrest. RVP + Metanpneumovirus. Discussed case with ID to consider COVID-19 testing however patient has alternative diagnosis and does not have travel exposure or positive patient contact to be considered for testing. Family expressed understanding.  Objective   Blood pressure (!) 179/93, pulse (!) 105, temperature 98.1 F (36.7 C), temperature source Oral, resp. rate 20, height 6' (1.829 m), weight 118.9 kg, SpO2 98 %. CVP:  [6 mmHg-9 mmHg] 7 mmHg  Vent Mode: PRVC FiO2 (%):  [40 %-50 %] 40 % Set Rate:  [20 bmp] 20 bmp Vt Set:  [620 mL] 620 mL PEEP:  [5 cmH20] 5 cmH20 Plateau Pressure:  [20 cmH20-23 cmH20] 21 cmH20   Intake/Output Summary (Last 24 hours) at 04/03/2018 0848 Last data filed at 04/03/2018 0556 Gross per 24 hour  Intake 1499.45 ml  Output 5025 ml  Net -3525.55 ml   Filed Weights   04/16/2018 0704 04/01/18 0500 04/02/18 0500  Weight: 120.3 kg 121.3 kg 118.9 kg    Physical Exam: General: Unresponsive, sedated and mechanical ventilation HENT: [place], AT, ETT in place Eyes: EOMI, no scleral icterus Respiratory: Clear to auscultation bilaterally.  No crackles, wheezing or rales Cardiovascular: RRR, -M/R/G, no JVD GI: BS+, soft, nontender Extremities: 1+ Edema,-tenderness Neuro: Unresponsive, gag reflex present, downward gaze, oculoreflex intact, nonpurposeful gaze Skin: Intact, no rashes or bruising Psych: Normal mood, normal affect GU: Foley in place  Assessment &  Plan:   S/p pulseless cardiac arrest - Likely secondary to hypoxemia Plan: Supportive care as noted below  Acute hypoxemic and hypercarbic respiratory failure secondary to CAP, metapneumovirus, cardiac  arrest, ?flash pulmonary edema  TTE normal. Evidence of volume overload on BNP and exam. Discussed case with ID to consider COVID-19 testing however patient has alternative diagnosis and does not have travel exposure or positive patient contact to be considered for testing. Family expressed understanding. Plan: Full vent support Unable to wean due to mental status Continue empiric CAP coverage x 5 days Continue daily diuresis  AMS - unclear etiology.   Head CT, MR Brain and EEG negative. Cirrhosis on CT - Unclear etiology. No evidence of ascites on [place]. OSH ammonia 9.0 Plan: Neurology consult Repeat ammonia Start lactulose   Hx of schizophrenia. Reportedly agitated on admit; however unresponsive since 3/17 Plan: Neuro consult as above  AKI  Improving Likely secondary ATN in setting of arrest and diuretic use Plan: Continue diuresis daily Monitor Cr/UOP  Atrial fibrillation On diltiazem at home Plan: Hold anticoagulation Start metoprolol 12.5 mg BID for now Hold preadmission diltiazem  HTN Plan: Continue lopressor, hydralazine PRN  DM2 RLE Diabetic Ulcer Plan: CBG q4 Wound care consult   Best practice:  Diet: NPO Pain/Anxiety/Delirium protocol (if indicated): None required.  VAP protocol (if indicated): Yes DVT prophylaxis: SQ heparin GI prophylaxis: PPI daily Glucose control: CBG q4h, SSI Mobility: BR Code Status: Full Family Communication: Family meeting with patient's sister and brother Disposition: Remain in ICU   Critical Care Time: 50 min  The patient is critically ill with multiple organ systems failure and requires high complexity decision making for assessment and support, frequent evaluation and titration of therapies, application of advanced monitoring technologies and extensive interpretation of multiple databases.   Rodman Pickle, M.D. [facility] Pulmonary/Critical Care Medicine Pager: [phone] After hours pager: [phone]

## 2018-04-03 NOTE — Progress Notes (Signed)
Inpatient Diabetes Program Recommendations  AACE/ADA: New Consensus Statement on Inpatient Glycemic Control (2015)  Target Ranges:  Prepandial:   less than 140 mg/dL      Peak postprandial:   less than 180 mg/dL (1-2 hours)      Critically ill patients:  140 - 180 mg/dL   Lab Results  Component Value Date   GLUCAP 199 (H) 04/03/2018    Review of Glycemic Control Results for UMBERTO, PAVEK (MRN [id number]) as of 04/03/2018 13:37  Ref. Range 04/02/2018 23:21 04/03/2018 03:31 04/03/2018 07:24 04/03/2018 10:59  Glucose-Capillary Latest Ref Range: 70 - 99 mg/dL 153 (H) 194 (H) 184 (H) 199 (H)   Diabetes history: Type 2 DM Outpatient Diabetes medications: Jardiance 25 mg QAM, Tresiba 45 units QD Current orders for Inpatient glycemic control: none  Solumedrol 60 mg QD  Inpatient Diabetes Program Recommendations:    No noted A1C, consider repeating A1C?  Additionally, noted the initiation of tube feeds. Consider adding Novolog 2-6 units Q4H for correction.   Thanks, Bronson Curb, MSN, RNC-OB Diabetes Coordinator [phone] (8a-5p)

## 2018-04-04 ENCOUNTER — Inpatient Hospital Stay (HOSPITAL_COMMUNITY): Payer: Medicare Other

## 2018-04-04 LAB — BASIC METABOLIC PANEL
Anion gap: 3 — ABNORMAL LOW (ref 5–15)
Anion gap: 7 (ref 5–15)
BUN: 25 mg/dL — ABNORMAL HIGH (ref 8–23)
BUN: 35 mg/dL — ABNORMAL HIGH (ref 8–23)
CO2: 20 mmol/L — ABNORMAL LOW (ref 22–32)
CO2: 26 mmol/L (ref 22–32)
Calcium: 4.7 mg/dL — CL (ref 8.9–10.3)
Calcium: 6.6 mg/dL — ABNORMAL LOW (ref 8.9–10.3)
Chloride: 126 mmol/L — ABNORMAL HIGH (ref 98–111)
Chloride: 118 mmol/L — ABNORMAL HIGH (ref 98–111)
Creatinine, Ser: 0.94 mg/dL (ref 0.61–1.24)
Creatinine, Ser: 1.33 mg/dL — ABNORMAL HIGH (ref 0.61–1.24)
GFR calc non Af Amer: >60 mL/min (ref >60)
GFR calc non Af Amer: 55 mL/min — ABNORMAL LOW (ref >60)
GLUCOSE: 713 mg/dL — AB (ref 70–99)
Glucose, Bld: 318 mg/dL — ABNORMAL HIGH (ref 70–99)
Potassium: <2 mmol/L — CL (ref 3.5–5.1)
Potassium: 2.3 mmol/L — CL (ref 3.5–5.1)
Sodium: 149 mmol/L — ABNORMAL HIGH (ref 135–145)
Sodium: 151 mmol/L — ABNORMAL HIGH (ref 135–145)

## 2018-04-04 LAB — CULTURE, RESPIRATORY W GRAM STAIN: Special Requests: NORMAL

## 2018-04-04 LAB — GLUCOSE, CAPILLARY
GLUCOSE-CAPILLARY: 262 mg/dL — AB (ref 70–99)
Glucose-Capillary: 233 mg/dL — ABNORMAL HIGH (ref 70–99)
Glucose-Capillary: 256 mg/dL — ABNORMAL HIGH (ref 70–99)
Glucose-Capillary: 277 mg/dL — ABNORMAL HIGH (ref 70–99)
Glucose-Capillary: 242 mg/dL — ABNORMAL HIGH (ref 70–99)
Glucose-Capillary: 260 mg/dL — ABNORMAL HIGH (ref 70–99)
Glucose-Capillary: 240 mg/dL — ABNORMAL HIGH (ref 70–99)
Glucose-Capillary: 245 mg/dL — ABNORMAL HIGH (ref 70–99)

## 2018-04-04 LAB — CBC
HCT: 41.8 % (ref 39.0–52.0)
Hemoglobin: 12.3 g/dL — ABNORMAL LOW (ref 13.0–17.0)
MCH: 22.5 pg — ABNORMAL LOW (ref 26.0–34.0)
MCHC: 29.4 g/dL — ABNORMAL LOW (ref 30.0–36.0)
MCV: 76.6 fL — ABNORMAL LOW (ref 80.0–100.0)
NRBC: 0 % (ref 0.0–0.2)
PLATELETS: 129 10*3/uL — AB (ref 150–400)
RBC: 5.46 MIL/uL (ref 4.22–5.81)
RDW: 20.1 % — ABNORMAL HIGH (ref 11.5–15.5)
WBC: 5.6 10*3/uL (ref 4.0–10.5)

## 2018-04-04 LAB — HEMOGLOBIN A1C
Hgb A1c MFr Bld: 8 % — ABNORMAL HIGH (ref 4.8–5.6)
Mean Plasma Glucose: 182.9 mg/dL

## 2018-04-04 LAB — POTASSIUM: Potassium: 3.5 mmol/L (ref 3.5–5.1)

## 2018-04-04 LAB — PHOSPHORUS
Phosphorus: 1.1 mg/dL — ABNORMAL LOW (ref 2.5–4.6)
Phosphorus: 1.5 mg/dL — ABNORMAL LOW (ref 2.5–4.6)

## 2018-04-04 LAB — MAGNESIUM
Magnesium: 1 mg/dL — ABNORMAL LOW (ref 1.7–2.4)
Magnesium: 1.3 mg/dL — ABNORMAL LOW (ref 1.7–2.4)

## 2018-04-04 LAB — CULTURE, RESPIRATORY

## 2018-04-04 MED ORDER — METOPROLOL TARTRATE 5 MG/5ML IV SOLN
INTRAVENOUS | Status: AC
  Administered 2018-04-04: 08:00:00
  Filled 2018-04-04: qty 5

## 2018-04-04 MED ORDER — POTASSIUM CITRATE-CITRIC ACID 1100-334 MG/5ML PO SOLN: 10.0000 meq | Freq: Three times a day (TID) | ORAL | Status: DC

## 2018-04-04 MED ORDER — POLYVINYL ALCOHOL 1.4 % OP SOLN
1.0000 [drp] | OPHTHALMIC | Status: DC | PRN
  Administered 2018-04-04: 1 [drp] via OPHTHALMIC
  Filled 2018-04-04: qty 15

## 2018-04-04 MED ORDER — POTASSIUM PHOSPHATES 15 MMOLE/5ML IV SOLN
10.0000 mmol | Freq: Once | INTRAVENOUS | Status: AC
  Administered 2018-04-04: 10 mmol via INTRAVENOUS
  Filled 2018-04-04: qty 3.33

## 2018-04-04 MED ORDER — MAGNESIUM SULFATE 2 GM/50ML IV SOLN
2.0000 g | Freq: Once | INTRAVENOUS | Status: AC
  Administered 2018-04-04: 2 g via INTRAVENOUS
  Filled 2018-04-04: qty 50

## 2018-04-04 MED ORDER — METOPROLOL TARTRATE 5 MG/5ML IV SOLN
5.0000 mg | Freq: Once | INTRAVENOUS | Status: AC
  Administered 2018-04-04: 5 mg via INTRAVENOUS

## 2018-04-04 MED ORDER — POTASSIUM CHLORIDE 20 MEQ/15ML (10%) PO SOLN
40.0000 meq | Freq: Every day | ORAL | Status: DC
  Administered 2018-04-04 – 2018-04-07 (×4): 40 meq
  Filled 2018-04-04 (×5): qty 30

## 2018-04-04 MED ORDER — POTASSIUM CHLORIDE 10 MEQ/50ML IV SOLN
10.0000 meq | INTRAVENOUS | Status: AC
  Administered 2018-04-04 (×3): 10 meq via INTRAVENOUS
  Filled 2018-04-04 (×2): qty 50

## 2018-04-04 MED ORDER — SODIUM PHOSPHATES 45 MMOLE/15ML IV SOLN: 10.0000 mmol | Freq: Once | INTRAVENOUS | Status: DC

## 2018-04-04 MED ORDER — INSULIN ASPART 100 UNIT/ML ~~LOC~~ SOLN
3.0000 [IU] | SUBCUTANEOUS | Status: DC
  Administered 2018-04-04 (×3): 9 [IU] via SUBCUTANEOUS

## 2018-04-04 MED ORDER — INSULIN REGULAR(HUMAN) IN NACL 100-0.9 UT/100ML-% IV SOLN
INTRAVENOUS | Status: DC
  Administered 2018-04-05: 1.9 [IU]/h via INTRAVENOUS
  Filled 2018-04-04: qty 100

## 2018-04-04 MED ORDER — CALCIUM GLUCONATE-NACL 2-0.675 GM/100ML-% IV SOLN
2.0000 g | Freq: Once | INTRAVENOUS | Status: AC
  Administered 2018-04-04: 2000 mg via INTRAVENOUS
  Filled 2018-04-04 (×2): qty 100

## 2018-04-04 MED ORDER — SODIUM CHLORIDE 0.9 % IV BOLUS: 500.0000 mL | Freq: Once | INTRAVENOUS | Status: DC

## 2018-04-04 MED ORDER — DILTIAZEM LOAD VIA INFUSION
20.0000 mg | Freq: Once | INTRAVENOUS | Status: AC
  Administered 2018-04-04: 20 mg via INTRAVENOUS
  Filled 2018-04-04: qty 20

## 2018-04-04 MED ORDER — DILTIAZEM HCL-DEXTROSE 100-5 MG/100ML-% IV SOLN (PREMIX)
5.0000 mg/h | INTRAVENOUS | Status: AC
  Administered 2018-04-04: 15 mg/h via INTRAVENOUS
  Administered 2018-04-04: 5 mg/h via INTRAVENOUS
  Administered 2018-04-04 – 2018-04-07 (×9): 15 mg/h via INTRAVENOUS
  Filled 2018-04-04 (×12): qty 100

## 2018-04-04 MED ORDER — MAGNESIUM SULFATE 2 GM/50ML IV SOLN: 2.0000 g | Freq: Once | INTRAVENOUS | Status: DC

## 2018-04-04 MED ORDER — FREE WATER
200.0000 mL | Status: DC
  Administered 2018-04-04 – 2018-04-06 (×10): 200 mL

## 2018-04-04 MED ORDER — POTASSIUM & SODIUM PHOSPHATES 280-160-250 MG PO PACK
2.0000 | PACK | Freq: Once | ORAL | Status: AC
  Administered 2018-04-04: 2 via ORAL
  Filled 2018-04-04: qty 2

## 2018-04-04 MED ORDER — POTASSIUM CHLORIDE 10 MEQ/50ML IV SOLN
10.0000 meq | INTRAVENOUS | Status: DC
  Filled 2018-04-04: qty 50

## 2018-04-04 MED ORDER — METOPROLOL TARTRATE 25 MG PO TABS
25.0000 mg | ORAL_TABLET | Freq: Two times a day (BID) | ORAL | Status: DC
  Administered 2018-04-04 – 2018-04-05 (×3): 25 mg via ORAL
  Filled 2018-04-04 (×3): qty 1

## 2018-04-04 MED ORDER — POTASSIUM PHOSPHATES 15 MMOLE/5ML IV SOLN
15.0000 mmol | Freq: Once | INTRAVENOUS | Status: DC
  Filled 2018-04-04: qty 5

## 2018-04-04 NOTE — Progress Notes (Addendum)
[facility] Progress Note Patient Name: AVRIAN DELFAVERO DOB: [birth year] MRN: [id number]   Date of Service  04/04/2018  HPI/Events of Note  Notified of abnormal labs.   Ka <2, Cl 126, Na 149, BUN/Crea 25/0.94, Calcium 4.7, phos 1.1, Mg 1.0   eICU Interventions  Repeat BMP stat. If repeat is the same, electrolytes should be repleted. Repletion for K ordered - KCL 38meq PO, 11meq IV, and 18meq TID.  MgSo4 ordered. Calcium gluconate and PhosNak ordered. Recheck BMP around 2pm.     Intervention Category Intermediate Interventions: Electrolyte abnormality - evaluation and management  Elsie Lincoln 04/04/2018, 4:44 AM   6:42 AM  Repeat labs reviewed.  Continue with electrolyte repletion as ordered.  Glucose was in the 700s and when retested with POC glucose, this correlated with previous tests in the 200s.    Plan> Replete lytes.  Continue with insulin coverage.

## 2018-04-04 NOTE — Progress Notes (Signed)
Subjective: Slight improvement overnight  Exam: Vitals:   04/04/18 0719 04/04/18 0827  BP: (!) 188/110 (!) 159/87  Pulse:  (!) 102  Resp:  20  Temp:    SpO2:  96%   Gen: In bed, NAD Resp: non-labored breathing, no acute distress Abd: soft, nt  Neuro: MS: Opens eyes spontaneously,  WL:SLHT are slightly to the left, but not clearly deviated, he does not fixate or track.  Motor: slight flexion x 4 Sensory:as above.   Pertinent Labs: Ammonia 22  Impression: 68 year old male initially presenting for abdominal pain and shortness of breath then suffering cardiac arrest 3/15 with downtime approximately 6 minutes.   With normal MRI and intact brainstem, I do not think that we can rule out the possibility that he may have some improvement over time.  Also, with his history of cirrhosis and the triphasics that were seen on EEG, I would continue  aggressively for hepatic encephalopathy.  Ceftriaxone can be associated with encephalopathy, though not as much as  Cefepime or ceftazidime, if he does not improve with treatment for hepatic encephalopathy I think that changing from a cephalosporin to another agent would be indicated.   Recommendations: 1) would favor continued supportive care from neurological perspsective  Roland Rack, MD [facility] [phone]  If 7pm- 7am, please page neurology on call as listed in [facility].

## 2018-04-04 NOTE — Progress Notes (Signed)
Pt tested positive for metapneumovirus. Called IP to find out if isolation precautions are need. Angie with IP confirmed only standard precautions required for this virus.  Primary nurse notified of this.

## 2018-04-04 NOTE — Progress Notes (Signed)
If HR persists >130 give 10mg  of diltiazem injection per Dr. Loanne Drilling

## 2018-04-04 NOTE — Progress Notes (Signed)
NAME:  RAMESH MOAN, MRN:  [id number], DOB:  [birth year], LOS: 4 ADMISSION DATE:  03/18/2018, CONSULTATION DATE:  03/27/2018 REFERRING MD:  Colin Rhein [facility] ED CHIEF COMPLAINT:  Abdominal pain  Brief History   68 year old male initially presented with weakness, dyspnea and abdominal pain to [facility]. Cardiac arrest on 3/15 requiring intubation.  HPI  68 year old male with schizophrenia and diabetes who presented to [place] on 3/13 for abdominal pain and hypoxemia. Admission labs with WBC 7.6 with N 72% and L 12% (abs 0.91). ABG 7.4/42/68. Trop 0.02. Influenza A/B negative. CT A/P with bilateral pleural effusions, small ascites but otherwise no evidence of acute abdominal abnormalities. On admission, he required 4L [place] however became hypoxemic on 3/14 requiring face mask and then BiPAP.   Sister provides additional history. Prior to admission, patient developed severe dyspnea. Unsure if he had fevers or chills. He has chronic abdominal pain that has been evaluated in the past. Denies hx of respiratory illness or recent travel. Denies direct sick contacts however expresses concern that she has taken her brother to several doctor visits in the in the last month related to his diabetic ulcer wound and has been exposed to waiting rooms with patients who had respiratory symptoms.  Past Medical History  Atrial fibrillation, chronic anticoagulation, CAD, DM2, HTN, HLD, schizophrenia  Significant Hospital Events   3/14 Transferred from [place] 3/15 Intubated  Consults:  PCCM  Procedures:  ETT 3/15>  Significant Diagnostic Tests:  CT A/P > Consistent with cirrhosis with a small amount of ascites. Bilateral pleural effusions. Generalized edema.  No evidence of bowel obstruction. TTE 3/14 > EF 60-65%. No WMA. Mild concentric LVH CXR 3/16 > Bilateral airspace disease R>L. Pulmonary congestion. CT head 3/16 > no acute process. EEG 3/17 > no seizure activity.  Generalized encephalopathy.  Micro Data:    Antimicrobials:  Ceftriaxone 3/14 > Azithro 3/14 >  Interim history/subjective:  Off sedation. Remains unresponsive. Family meeting held this morning. Sister and brother expresses concern for COVID-19. We discussed patient's clinical course and rapid respiratory decline c/b cardiac arrest. RVP + Metanpneumovirus. Discussed case with ID to consider COVID-19 testing however patient has alternative diagnosis and does not have travel exposure or positive patient contact to be considered for testing. Family expressed understanding.  Objective   Blood pressure (!) 156/100, pulse (!) 112, temperature 100.1 F (37.8 C), temperature source Axillary, resp. rate 20, height 6' (1.829 m), weight 112 kg, SpO2 96 %.    Vent Mode: PRVC FiO2 (%):  [40 %] 40 % Set Rate:  [20 bmp] 20 bmp Vt Set:  [620 mL] 620 mL PEEP:  [5 cmH20] 5 cmH20 Plateau Pressure:  [19 cmH20-25 cmH20] 23 cmH20   Intake/Output Summary (Last 24 hours) at 04/04/2018 1031 Last data filed at 04/04/2018 0600 Gross per 24 hour  Intake 2459.29 ml  Output 3850 ml  Net -1390.71 ml   Filed Weights   04/01/18 0500 04/02/18 0500 04/04/18 0222  Weight: 121.3 kg 118.9 kg 112 kg    Physical Exam: General: Poorly responsive opens eyes to noxious stimuli does not track or follow HEENT: Opens eyes to noxious Has a downward gaze preference endotracheal tube is in place Neuro: Poorly responsive CV: Heart sounds are regular irregular with atrial fibrillation rapid reticular response PULM: even/non-labored, lungs bilaterally coarse rhonchi GI: Faint bowel sounds, no fluid wave is appreciated Extremities: Bilateral lower extremity venous stasis is noted with stage II ulcer on right foot Skin:  no rashes or lesions   Assessment & Plan:   S/p pulseless cardiac arrest - Likely secondary to hypoxemia Plan: As noted below  Acute hypoxemic and hypercarbic respiratory failure secondary to CAP, metapneumovirus, cardiac arrest,  ?flash pulmonary edema  TTE normal. Evidence of volume overload on BNP and exam. Discussed case with ID to consider COVID-19 testing however patient has alternative diagnosis and does not have travel exposure or positive patient contact to be considered for testing. Family expressed understanding. Plan: Full ventilatory support Unable to wean due to altered mental status Empiric community-acquired pneumonia coverage for 5 days currently on 2 5 Resume diuresis on 04/05/2018  AMS - unclear etiology.   Head CT, MR Brain and EEG negative. Cirrhosis on CT - Unclear etiology. No evidence of ascites on [place]. OSH ammonia 9.0 Plan:  Neuro input appreciated Follow-up ammonia  Continue lactulose  Hx of schizophrenia. Reportedly agitated on admit; however unresponsive since 3/17 Plan: Neuro consult appreciated  AKI  Lab Results  Component Value Date   CREATININE 1.33 (H) 04/04/2018   CREATININE 0.94 04/04/2018   CREATININE 1.82 (H) 04/03/2018   CREATININE 0.91 11/11/2013   Recent Labs  Lab 04/03/18 0347 04/04/18 0322 04/04/18 0504  K 3.1* <2.0* 2.3*    Improving Likely secondary ATN in setting of arrest and diuretic use Plan: Hold Lasix 04/04/2018 as he was given fluid bolus for atrial replete potassium Recheck be met  Atrial fibrillation On diltiazem at home Plan: Hold anticoagulation Metroprolol Cardizem drip Hold Lasix  HTN Plan: Continue lopressor, hydralazine PRN  DM2 RLE Diabetic Ulcer CBG (last 3)  Recent Labs    04/04/18 0320 04/04/18 0621 04/04/18 0904  GLUCAP 256* 277* 242*    Plan: Scale insulin every 4 hours Wound care consult   Best practice:  Diet: NPO Pain/Anxiety/Delirium protocol (if indicated): Off all sedation secondary to altered mental status VAP protocol (if indicated): Yes DVT prophylaxis: SQ heparin GI prophylaxis: PPI daily Glucose control: CBG q4h, SSI Mobility: BR Code Status: Full Family Communication: 04/04/2018 no family at  bedside Disposition: Remain in ICU   Critical Care Time per App: 30 min  Richardson Landry Minor ACNP [facility] PCCM Pager [phone] till 1 pm If no answer page [phone] 04/04/2018, 10:31 AM

## 2018-04-05 ENCOUNTER — Inpatient Hospital Stay (HOSPITAL_COMMUNITY): Payer: Medicare Other

## 2018-04-05 DIAGNOSIS — R140 Abdominal distension (gaseous): Secondary | ICD-10-CM

## 2018-04-05 LAB — TSH: TSH: 1.298 u[IU]/mL (ref 0.350–4.500)

## 2018-04-05 LAB — CBC WITH DIFFERENTIAL/PLATELET
Abs Immature Granulocytes: 0.03 10*3/uL (ref 0.00–0.07)
Basophils Absolute: 0 10*3/uL (ref 0.0–0.1)
Basophils Relative: 0 %
Eosinophils Absolute: 0.1 10*3/uL (ref 0.0–0.5)
Eosinophils Relative: 1 %
HCT: 45.7 % (ref 39.0–52.0)
HEMOGLOBIN: 13.5 g/dL (ref 13.0–17.0)
Immature Granulocytes: 0 %
LYMPHS PCT: 9 %
Lymphs Abs: 0.8 10*3/uL (ref 0.7–4.0)
MCH: 23.1 pg — ABNORMAL LOW (ref 26.0–34.0)
MCHC: 29.5 g/dL — AB (ref 30.0–36.0)
MCV: 78.1 fL — ABNORMAL LOW (ref 80.0–100.0)
Monocytes Absolute: 0.7 10*3/uL (ref 0.1–1.0)
Monocytes Relative: 8 %
Neutro Abs: 7.1 10*3/uL (ref 1.7–7.7)
Neutrophils Relative %: 82 %
Platelets: 145 10*3/uL — ABNORMAL LOW (ref 150–400)
RBC: 5.85 MIL/uL — ABNORMAL HIGH (ref 4.22–5.81)
RDW: 20.7 % — ABNORMAL HIGH (ref 11.5–15.5)
WBC: 8.7 10*3/uL (ref 4.0–10.5)
nRBC: 0 % (ref 0.0–0.2)

## 2018-04-05 LAB — MAGNESIUM
Magnesium: 2.1 mg/dL (ref 1.7–2.4)
Magnesium: 1.8 mg/dL (ref 1.7–2.4)
Magnesium: 1.8 mg/dL (ref 1.7–2.4)

## 2018-04-05 LAB — BASIC METABOLIC PANEL
Anion gap: 11 (ref 5–15)
Anion gap: 8 (ref 5–15)
BUN: 41 mg/dL — AB (ref 8–23)
BUN: 37 mg/dL — ABNORMAL HIGH (ref 8–23)
CHLORIDE: 106 mmol/L (ref 98–111)
CO2: 30 mmol/L (ref 22–32)
CO2: 32 mmol/L (ref 22–32)
Calcium: 8.8 mg/dL — ABNORMAL LOW (ref 8.9–10.3)
Calcium: 8.4 mg/dL — ABNORMAL LOW (ref 8.9–10.3)
Chloride: 113 mmol/L — ABNORMAL HIGH (ref 98–111)
Creatinine, Ser: 1.38 mg/dL — ABNORMAL HIGH (ref 0.61–1.24)
Creatinine, Ser: 1.23 mg/dL (ref 0.61–1.24)
GFR calc Af Amer: >60 mL/min (ref >60)
GFR calc Af Amer: >60 mL/min (ref >60)
GFR calc non Af Amer: 53 mL/min — ABNORMAL LOW (ref >60)
GFR calc non Af Amer: >60 mL/min (ref >60)
Glucose, Bld: 302 mg/dL — ABNORMAL HIGH (ref 70–99)
Glucose, Bld: 185 mg/dL — ABNORMAL HIGH (ref 70–99)
Potassium: 2.7 mmol/L — CL (ref 3.5–5.1)
Potassium: 3.5 mmol/L (ref 3.5–5.1)
Sodium: 147 mmol/L — ABNORMAL HIGH (ref 135–145)
Sodium: 153 mmol/L — ABNORMAL HIGH (ref 135–145)

## 2018-04-05 LAB — HEPARIN LEVEL (UNFRACTIONATED): Heparin Unfractionated: 0.44 IU/mL (ref 0.30–0.70)

## 2018-04-05 LAB — PHOSPHORUS
Phosphorus: 2.5 mg/dL (ref 2.5–4.6)
Phosphorus: 2.5 mg/dL (ref 2.5–4.6)

## 2018-04-05 LAB — AMMONIA: Ammonia: 22 umol/L (ref 9–35)

## 2018-04-05 LAB — GLUCOSE, CAPILLARY
GLUCOSE-CAPILLARY: 259 mg/dL — AB (ref 70–99)
GLUCOSE-CAPILLARY: 197 mg/dL — AB (ref 70–99)
GLUCOSE-CAPILLARY: 231 mg/dL — AB (ref 70–99)
Glucose-Capillary: 246 mg/dL — ABNORMAL HIGH (ref 70–99)
Glucose-Capillary: 224 mg/dL — ABNORMAL HIGH (ref 70–99)
Glucose-Capillary: 199 mg/dL — ABNORMAL HIGH (ref 70–99)
Glucose-Capillary: 148 mg/dL — ABNORMAL HIGH (ref 70–99)
Glucose-Capillary: 146 mg/dL — ABNORMAL HIGH (ref 70–99)
Glucose-Capillary: 141 mg/dL — ABNORMAL HIGH (ref 70–99)
Glucose-Capillary: 121 mg/dL — ABNORMAL HIGH (ref 70–99)
Glucose-Capillary: 129 mg/dL — ABNORMAL HIGH (ref 70–99)
Glucose-Capillary: 147 mg/dL — ABNORMAL HIGH (ref 70–99)
Glucose-Capillary: 205 mg/dL — ABNORMAL HIGH (ref 70–99)

## 2018-04-05 LAB — CK: Total CK: 74 U/L (ref 49–397)

## 2018-04-05 MED ORDER — POTASSIUM CHLORIDE 20 MEQ/15ML (10%) PO SOLN
40.0000 meq | Freq: Once | ORAL | Status: AC
  Administered 2018-04-05: 40 meq via ORAL

## 2018-04-05 MED ORDER — INSULIN ASPART 100 UNIT/ML ~~LOC~~ SOLN
2.0000 [IU] | SUBCUTANEOUS | Status: DC
  Administered 2018-04-05: 6 [IU] via SUBCUTANEOUS
  Administered 2018-04-05: 2 [IU] via SUBCUTANEOUS
  Administered 2018-04-05 (×2): 6 [IU] via SUBCUTANEOUS
  Administered 2018-04-06: 4 [IU] via SUBCUTANEOUS
  Administered 2018-04-06: 6 [IU] via SUBCUTANEOUS
  Administered 2018-04-06 (×2): 4 [IU] via SUBCUTANEOUS
  Administered 2018-04-06: 6 [IU] via SUBCUTANEOUS

## 2018-04-05 MED ORDER — INSULIN DETEMIR 100 UNIT/ML ~~LOC~~ SOLN
12.0000 [IU] | Freq: Two times a day (BID) | SUBCUTANEOUS | Status: DC
  Administered 2018-04-05 – 2018-04-06 (×3): 12 [IU] via SUBCUTANEOUS
  Filled 2018-04-05 (×5): qty 0.12

## 2018-04-05 MED ORDER — METOPROLOL TARTRATE 50 MG PO TABS
50.0000 mg | ORAL_TABLET | Freq: Two times a day (BID) | ORAL | Status: DC
  Administered 2018-04-05 – 2018-04-07 (×5): 50 mg via ORAL
  Filled 2018-04-05 (×5): qty 1

## 2018-04-05 MED ORDER — HEPARIN BOLUS VIA INFUSION
2500.0000 [IU] | Freq: Once | INTRAVENOUS | Status: AC
  Administered 2018-04-05: 2500 [IU] via INTRAVENOUS
  Filled 2018-04-05: qty 2500

## 2018-04-05 MED ORDER — INSULIN GLARGINE 100 UNIT/ML ~~LOC~~ SOLN
10.0000 [IU] | Freq: Every day | SUBCUTANEOUS | Status: DC
  Administered 2018-04-05: 10 [IU] via SUBCUTANEOUS
  Filled 2018-04-05: qty 0.1

## 2018-04-05 MED ORDER — POTASSIUM CHLORIDE CRYS ER 20 MEQ PO TBCR: 40.0000 meq | EXTENDED_RELEASE_TABLET | Freq: Once | ORAL | Status: DC

## 2018-04-05 MED ORDER — HEPARIN (PORCINE) 25000 UT/250ML-% IV SOLN
1800.0000 [IU]/h | INTRAVENOUS | Status: DC
  Administered 2018-04-05 – 2018-04-07 (×4): 1400 [IU]/h via INTRAVENOUS
  Administered 2018-04-08: 1700 [IU]/h via INTRAVENOUS
  Administered 2018-04-09: 1800 [IU]/h via INTRAVENOUS
  Administered 2018-04-09: 1700 [IU]/h via INTRAVENOUS
  Administered 2018-04-10: 1800 [IU]/h via INTRAVENOUS
  Filled 2018-04-05 (×8): qty 250

## 2018-04-05 MED ORDER — DEXTROSE 10 % IV SOLN: INTRAVENOUS | Status: DC | PRN

## 2018-04-05 MED ORDER — INSULIN ASPART 100 UNIT/ML ~~LOC~~ SOLN
4.0000 [IU] | SUBCUTANEOUS | Status: DC
  Administered 2018-04-05 – 2018-04-06 (×9): 4 [IU] via SUBCUTANEOUS

## 2018-04-05 MED ORDER — METOPROLOL TARTRATE 25 MG PO TABS
25.0000 mg | ORAL_TABLET | Freq: Once | ORAL | Status: AC
  Administered 2018-04-05: 25 mg via ORAL
  Filled 2018-04-05: qty 1

## 2018-04-05 NOTE — Progress Notes (Signed)
ANTICOAGULATION CONSULT NOTE   Pharmacy Consult for heparin Indication: atrial fibrillation  No Known Allergies  Patient Measurements: Height: 6' (182.9 cm) Weight: 239 lb 6.7 oz (108.6 kg) IBW/kg (Calculated) : 77.6 Heparin Dosing Weight: 100.5 kg  Vital Signs: Temp: 99.9 F (37.7 C) (03/20 1600) Temp Source: Oral (03/20 1600) BP: 153/79 (03/20 1800) Pulse Rate: 79 (03/20 1815)  Labs: Recent Labs    04/03/18 0304 04/03/18 0347 04/04/18 0322 04/04/18 0504 04/05/18 0514 04/05/18 1325 04/05/18 1809  HGB 12.8* 14.3 12.3*  --  13.5  --   --   HCT 42.6 42.0 41.8  --  45.7  --   --   PLT 129*  --  129*  --  145*  --   --   HEPARINUNFRC  --   --   --   --   --   --  0.44  CREATININE 1.82*  --  0.94 1.33* 1.38* 1.23  --   CKTOTAL  --   --   --   --   --  74  --     Estimated Creatinine Clearance: 75.2 mL/min (by C-G formula based on SCr of 1.23 mg/dL).   Medical History: Past Medical History:  Diagnosis Date  . Atrial fibrillation ([place])    a. persistent fib/flutter; rate controlled. refuses ablation or DCCV  . Chronic anticoagulation    Xarelto  . Coronary atherosclerosis of unspecified type of vessel, native or graft   . Diabetes mellitus, type II ([place])   . Diabetic neuropathy ([place])   . Hyperlipidemia   . Hypertension   . Non compliance w medication regimen    Per his mother  . Obese   . 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 Peripheral vascular disease ([place])   . Thrombocytopenia ([facility])     Medications:  Scheduled:  . chlorhexidine gluconate (MEDLINE KIT)  15 mL Mouth Rinse BID  . Chlorhexidine Gluconate Cloth  6 each Topical Daily  . feeding supplement (PRO-STAT SUGAR FREE 64)  30 mL Per Tube QID  . free water  200 mL Per Tube Q4H  . furosemide  40 mg Intravenous Daily  . hydrALAZINE  10 mg Oral Q8H  . insulin aspart  2-6 Units Subcutaneous Q4H  . insulin aspart  4 Units Subcutaneous Q4H  . insulin detemir  12 Units Subcutaneous Q12H  . lactulose   10 g Oral TID  . mouth rinse  15 mL Mouth Rinse 10 times per day  . metoprolol tartrate  50 mg Oral BID  . nystatin cream   Topical BID  . pantoprazole sodium  40 mg Per Tube Daily  . potassium chloride  40 mEq Per Tube Daily  . sodium chloride flush  10-40 mL Intracatheter Q12H    Assessment: 64 yom found to have metapneumovirus s/p cardiac arrest. Has prior history of Afib noted with questionable use of Xarelto PTA (listed as not taking PTA - adherence questions).  Was previously on [facility] for DVT ppx - now transition to heparin infusion given remains in rate-controlled Afib on metoprolol and diltiazem infusion.   Heparin at goal this evening on 1400 units/hr. No issues noted.   Goal of Therapy:  Heparin level 0.3-0.7 units/ml Monitor platelets by anticoagulation protocol: Yes   Plan:  Continue heparin infusion at 1400 units/hr Check anti-Xa level daily while on heparin Continue to monitor H&H and platelets  Erin Hearing PharmD., BCPS Clinical Pharmacist 04/05/2018 7:31 PM

## 2018-04-05 NOTE — Progress Notes (Signed)
ANTICOAGULATION CONSULT NOTE - Initial Consult  Pharmacy Consult for heparin Indication: atrial fibrillation  No Known Allergies  Patient Measurements: Height: 6' (182.9 cm) Weight: 239 lb 6.7 oz (108.6 kg) IBW/kg (Calculated) : 77.6 Heparin Dosing Weight: 100.5 kg  Vital Signs: Temp: 98 F (36.7 C) (03/20 0700) Temp Source: Axillary (03/20 0700) BP: 140/83 (03/20 0800) Pulse Rate: 70 (03/20 1030)  Labs: Recent Labs    04/03/18 0304 04/03/18 0347 04/04/18 0322 04/04/18 0504 04/05/18 0514  HGB 12.8* 14.3 12.3*  --  13.5  HCT 42.6 42.0 41.8  --  45.7  PLT 129*  --  129*  --  145*  CREATININE 1.82*  --  0.94 1.33* 1.38*    Estimated Creatinine Clearance: 67 mL/min (A) (by C-G formula based on SCr of 1.38 mg/dL (H)).   Medical History: Past Medical History:  Diagnosis Date  . Atrial fibrillation ([place])    a. persistent fib/flutter; rate controlled. refuses ablation or DCCV  . Chronic anticoagulation    Xarelto  . Coronary atherosclerosis of unspecified type of vessel, native or graft   . Diabetes mellitus, type II ([place])   . Diabetic neuropathy ([place])   . Hyperlipidemia   . Hypertension   . Non compliance w medication regimen    Per his mother  . Obese   . 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 Peripheral vascular disease ([place])   . Thrombocytopenia ([facility])     Medications:  Scheduled:  . chlorhexidine gluconate (MEDLINE KIT)  15 mL Mouth Rinse BID  . Chlorhexidine Gluconate Cloth  6 each Topical Daily  . feeding supplement (PRO-STAT SUGAR FREE 64)  30 mL Per Tube QID  . free water  200 mL Per Tube Q4H  . furosemide  40 mg Intravenous Daily  . heparin  2,500 Units Intravenous Once  . hydrALAZINE  10 mg Oral Q8H  . insulin glargine  10 Units Subcutaneous Daily  . lactulose  10 g Oral TID  . mouth rinse  15 mL Mouth Rinse 10 times per day  . metoprolol tartrate  25 mg Oral Once  . metoprolol tartrate  50 mg Oral BID  . nystatin cream   Topical BID  .  pantoprazole sodium  40 mg Per Tube Daily  . potassium chloride  40 mEq Per Tube Daily  . potassium chloride  40 mEq Oral Once  . sodium chloride flush  10-40 mL Intracatheter Q12H    Assessment: 81 yom found to have metapneumovirus s/p cardiac arrest. Has prior history of Afib noted with questionable use of Xarelto PTA (listed as not taking PTA - adherence questions).  Was previously on [facility] for DVT ppx - now transition to heparin infusion given remains in rate-controlled Afib on metoprolol and diltiazem infusion.   Hgb 13.5, plt 145. No s/sx of bleeding.   Goal of Therapy:  Heparin level 0.3-0.7 units/ml Monitor platelets by anticoagulation protocol: Yes   Plan:  Give 2500 units bolus x 1 Start heparin infusion at 1400 units/hr Check anti-Xa level in 6 hours and daily while on heparin Continue to monitor H&H and platelets  Antonietta Jewel, PharmD, [place] Clinical Pharmacist  Pager: [phone] Phone: [phone] 04/05/2018,11:35 AM

## 2018-04-05 NOTE — Plan of Care (Signed)
  Problem: Nutrition: Goal: Adequate nutrition will be maintained Outcome: Progressing   Problem: Pain Managment: Goal: General experience of comfort will improve Outcome: Progressing   Problem: Activity: Goal: Risk for activity intolerance will decrease Outcome: Not Progressing   Problem: Coping: Goal: Level of anxiety will decrease Outcome: Not Progressing   Problem: Elimination: Goal: Will not experience complications related to bowel motility Outcome: Adequate for Discharge

## 2018-04-05 NOTE — Progress Notes (Signed)
NAME:  Samuel Mccoy, MRN:  [id number], DOB:  Nov 21, 1950, LOS: 5 ADMISSION DATE:  03/17/2018, CONSULTATION DATE:  03/17/2018 REFERRING MD:  Colin Rhein [facility] ED CHIEF COMPLAINT:  Abdominal pain  Brief History   68 year old male initially presented with weakness, dyspnea and abdominal pain to [facility]. Cardiac arrest on 3/15 requiring intubation.  HPI  68 year old male with schizophrenia and diabetes who presented to [place] on 3/13 for abdominal pain and hypoxemia. Admission labs with WBC 7.6 with N 72% and L 12% (abs 0.91). ABG 7.4/42/68. Trop 0.02. Influenza A/B negative. CT A/P with bilateral pleural effusions, small ascites but otherwise no evidence of acute abdominal abnormalities. On admission, he required 4L [place] however became hypoxemic on 3/14 requiring face mask and then BiPAP.   Sister provides additional history. Prior to admission, patient developed severe dyspnea. Unsure if he had fevers or chills. He has chronic abdominal pain that has been evaluated in the past. Denies hx of respiratory illness or recent travel. Denies direct sick contacts however expresses concern that she has taken her brother to several doctor visits in the in the last month related to his diabetic ulcer wound and has been exposed to waiting rooms with patients who had respiratory symptoms.  Past Medical History  Atrial fibrillation, chronic anticoagulation, CAD, DM2, HTN, HLD, schizophrenia  Significant Hospital Events   3/14 Transferred from [place] 3/15 Intubated  Consults:  PCCM  Procedures:  ETT 3/15>  Significant Diagnostic Tests:  CT A/P > Consistent with cirrhosis with a small amount of ascites. Bilateral pleural effusions. Generalized edema.  No evidence of bowel obstruction. TTE 3/14 > EF 60-65%. No WMA. Mild concentric LVH CXR 3/16 > Bilateral airspace disease R>L. Pulmonary congestion. CT head 3/16 > no acute process. EEG 3/17 > no seizure activity.  Generalized encephalopathy.  Micro Data:    Antimicrobials:  Ceftriaxone 3/14 > Azithro 3/14 >  Interim history/subjective:  Off sedation. Remains unresponsive. Family meeting held this morning. Sister and brother expresses concern for COVID-19. We discussed patient's clinical course and rapid respiratory decline c/b cardiac arrest. RVP + Metanpneumovirus. Discussed case with ID to consider COVID-19 testing however patient has alternative diagnosis and does not have travel exposure or positive patient contact to be considered for testing. Family expressed understanding.  Objective   Blood pressure (!) 150/84, pulse 72, temperature 98 F (36.7 C), temperature source Axillary, resp. rate (!) 23, height 6' (1.829 m), weight 108.6 kg, SpO2 97 %. CVP:  [8 mmHg] 8 mmHg  Vent Mode: PRVC FiO2 (%):  [40 %] 40 % Set Rate:  [20 bmp] 20 bmp Vt Set:  [620 mL] 620 mL PEEP:  [5 cmH20] 5 cmH20 Plateau Pressure:  [19 cmH20-22 cmH20] 22 cmH20   Intake/Output Summary (Last 24 hours) at 04/05/2018 0855 Last data filed at 04/05/2018 0700 Gross per 24 hour  Intake 2307.12 ml  Output 3175 ml  Net -867.88 ml   Filed Weights   04/02/18 0500 04/04/18 0222 04/05/18 0241  Weight: 118.9 kg 112 kg 108.6 kg    Physical Exam: General: Poorly responsive opens eyes to noxious stimuli does not track or follow HEENT: Opens eyes to noxious Has a downward gaze preference endotracheal tube is in place Neuro: Poorly responsive CV: Heart sounds are regular irregular with atrial fibrillation rapid reticular response PULM: even/non-labored, lungs bilaterally coarse rhonchi GI: Faint bowel sounds, no fluid wave is appreciated Extremities: Bilateral lower extremity venous stasis is noted with stage II ulcer  on right foot Skin: no rashes or lesions   Assessment & Plan:   S/p pulseless cardiac arrest - Likely secondary to hypoxemia Plan: As noted below  Acute hypoxemic and hypercarbic respiratory failure secondary to CAP, metapneumovirus,  cardiac arrest, ?flash pulmonary edema  TTE normal. Evidence of volume overload on BNP and exam. Discussed case with ID to consider COVID-19 testing however patient has alternative diagnosis and does not have travel exposure or positive patient contact to be considered for testing. Family expressed understanding. Plan: Full ventilatory support Unable to wean due to altered mental status Empiric community-acquired pneumonia coverage for 5 days currently on 2 5 Resume diuresis on 04/05/2018  AMS - unclear etiology.   Head CT, MR Brain and EEG negative. Cirrhosis on CT - Unclear etiology. No evidence of ascites on [place]. OSH ammonia 9.0 Plan:  Neurology input greatly appreciated Ammonia level static at 22  Hx of schizophrenia. Reportedly agitated on admit; however unresponsive since 3/17 Plan: Neuro consult appreciated  AKI  Lab Results  Component Value Date   CREATININE 1.38 (H) 04/05/2018   CREATININE 1.33 (H) 04/04/2018   CREATININE 0.94 04/04/2018   CREATININE 0.91 11/11/2013   Recent Labs  Lab 04/04/18 0504 04/04/18 1329 04/05/18 0514  K 2.3* 3.5 2.7*    Continue to monitor creatinine Likely secondary to ATN   Plan: 04/05/2018 we will resume diuresis noted his blood pressure is adequate  Atrial fibrillation On diltiazem at home Plan: Holding anticoagulation  Metroprolol Cardizem drip  HTN Plan: Lopressor hydralazine PRN  DM2 RLE Diabetic Ulcer CBG (last 3)  Recent Labs    04/05/18 0427 04/05/18 0551 04/05/18 0725  GLUCAP 197* 148* 146*    Plan: Sliding Scale insulin per protocol currently on insulin drip Wound care consult noted on 04/01/18   Best practice:  Diet: NPO Pain/Anxiety/Delirium protocol (if indicated): Off all sedation secondary to altered mental status VAP protocol (if indicated): Yes DVT prophylaxis: SQ heparin GI prophylaxis: PPI daily Glucose control: CBG q4h, SSI Mobility: BR Code Status: Full Family Communication: 04/04/2018  no family at bedside Disposition: Remain in ICU   Critical Care Time per App: 30 min  Richardson Landry Jacek Colson ACNP [facility] PCCM Pager [phone] till 1 pm If no answer page [phone] 04/05/2018, 8:55 AM

## 2018-04-05 NOTE — Progress Notes (Addendum)
Subjective: Patient is intubated, does not follow commands. No family at bedside. Per nursing he seems to be doing better than yesterday. His gaze is more midline, and he is no longer posturing which she saw yesterday.   Exam: Vitals:   04/05/18 0830 04/05/18 0845  BP:    Pulse: 94 75  Resp: 20 20  Temp:    SpO2: 95% 96%   Gen: In bed, NAD Resp: non-labored breathing, no acute distress Abd: soft, nt  Neuro: MS: Opens eyes spontaneously, does not follow commands. Does not blink to threat.  CN: PERRL, patient eyes were midline today, slightly disconjugate,  Strong cough and gag ( per nursing)  he does not fixate or track.  Was able to cross midline with eyes, spontaneously.  Motor/ Sensory: withdrew to noxious in all 4 extremities.   Pertinent Labs: Ammonia WNL Na: 147 K: 2.7   Laurey Morale, MSN, NP-C [facility] [phone]    Attending Neurologist note to follow:  I have seen the patient reviewed the above note.  He had a leftward seeming gaze preference, however if I use doll's eye to position his eyes on the right they do not return to the left side.  He has reactive pupils and intact corneals, and today his motor response is significantly improved from yesterday, withdrawing in all 4 extremities.  Impression: 68 year old male initially presenting for abdominal pain and shortness of breath then suffering cardiac arrest 3/15 with downtime approximately 6 minutes.   With normal MRI and intact brainstem, I do not think that we can rule out the possibility that he may have some improvement over time.  Also, with his history of cirrhosis and the triphasics that were seen on EEG, I would continue  aggressively for hepatic encephalopathy.   Thus far we are seeing daily improvements, and I do wonder about mild hypoxic brain injury, but if this is the case, given the course we are seeing I would favor continued supportive care.  Recommendations: 1) would favor  continued supportive care from neurological perspsective 2) hypokalemia per CCM 3) neurology will follow  Roland Rack, MD [facility] [phone]  If 7pm- 7am, please page neurology on call as listed in [place].

## 2018-04-06 ENCOUNTER — Inpatient Hospital Stay (HOSPITAL_COMMUNITY): Payer: Medicare Other

## 2018-04-06 LAB — CBC WITH DIFFERENTIAL/PLATELET
Abs Immature Granulocytes: 0.11 10*3/uL — ABNORMAL HIGH (ref 0.00–0.07)
Basophils Absolute: 0.1 10*3/uL (ref 0.0–0.1)
Basophils Relative: 0 %
Eosinophils Absolute: 0.2 10*3/uL (ref 0.0–0.5)
Eosinophils Relative: 1 %
HCT: 48.1 % (ref 39.0–52.0)
Hemoglobin: 14 g/dL (ref 13.0–17.0)
Immature Granulocytes: 1 %
Lymphocytes Relative: 4 %
Lymphs Abs: 0.7 10*3/uL (ref 0.7–4.0)
MCH: 22.8 pg — ABNORMAL LOW (ref 26.0–34.0)
MCHC: 29.1 g/dL — ABNORMAL LOW (ref 30.0–36.0)
MCV: 78.5 fL — ABNORMAL LOW (ref 80.0–100.0)
Monocytes Absolute: 1.3 10*3/uL — ABNORMAL HIGH (ref 0.1–1.0)
Monocytes Relative: 7 %
Neutro Abs: 15.1 10*3/uL — ABNORMAL HIGH (ref 1.7–7.7)
Neutrophils Relative %: 87 %
Platelets: 201 10*3/uL (ref 150–400)
RBC: 6.13 MIL/uL — ABNORMAL HIGH (ref 4.22–5.81)
RDW: 21 % — ABNORMAL HIGH (ref 11.5–15.5)
WBC: 17.5 10*3/uL — ABNORMAL HIGH (ref 4.0–10.5)
nRBC: 0 % (ref 0.0–0.2)

## 2018-04-06 LAB — GLUCOSE, CAPILLARY
GLUCOSE-CAPILLARY: 198 mg/dL — AB (ref 70–99)
Glucose-Capillary: 181 mg/dL — ABNORMAL HIGH (ref 70–99)
Glucose-Capillary: 201 mg/dL — ABNORMAL HIGH (ref 70–99)
Glucose-Capillary: 228 mg/dL — ABNORMAL HIGH (ref 70–99)
Glucose-Capillary: 247 mg/dL — ABNORMAL HIGH (ref 70–99)

## 2018-04-06 LAB — MAGNESIUM
Magnesium: 1.9 mg/dL (ref 1.7–2.4)
Magnesium: 1.7 mg/dL (ref 1.7–2.4)

## 2018-04-06 LAB — BASIC METABOLIC PANEL
Anion gap: 10 (ref 5–15)
BUN: 44 mg/dL — ABNORMAL HIGH (ref 8–23)
CALCIUM: 9 mg/dL (ref 8.9–10.3)
CO2: 31 mmol/L (ref 22–32)
Chloride: 109 mmol/L (ref 98–111)
Creatinine, Ser: 1.31 mg/dL — ABNORMAL HIGH (ref 0.61–1.24)
GFR calc Af Amer: >60 mL/min (ref >60)
GFR calc non Af Amer: 56 mL/min — ABNORMAL LOW (ref >60)
Glucose, Bld: 196 mg/dL — ABNORMAL HIGH (ref 70–99)
Potassium: 3.2 mmol/L — ABNORMAL LOW (ref 3.5–5.1)
Sodium: 150 mmol/L — ABNORMAL HIGH (ref 135–145)

## 2018-04-06 LAB — HEPARIN LEVEL (UNFRACTIONATED): Heparin Unfractionated: 0.41 IU/mL (ref 0.30–0.70)

## 2018-04-06 LAB — PHOSPHORUS
PHOSPHORUS: 2.1 mg/dL — AB (ref 2.5–4.6)
Phosphorus: 3 mg/dL (ref 2.5–4.6)

## 2018-04-06 MED ORDER — FUROSEMIDE 10 MG/ML IJ SOLN
40.0000 mg | Freq: Once | INTRAMUSCULAR | Status: AC
  Administered 2018-04-06: 40 mg via INTRAVENOUS
  Filled 2018-04-06: qty 4

## 2018-04-06 MED ORDER — POTASSIUM CHLORIDE 20 MEQ/15ML (10%) PO SOLN
30.0000 meq | ORAL | Status: DC
  Administered 2018-04-06: 30 meq
  Filled 2018-04-06: qty 30

## 2018-04-06 MED ORDER — POTASSIUM CHLORIDE 20 MEQ/15ML (10%) PO SOLN
40.0000 meq | Freq: Once | ORAL | Status: AC
  Administered 2018-04-06: 40 meq via ORAL
  Filled 2018-04-06: qty 30

## 2018-04-06 MED ORDER — FREE WATER
250.0000 mL | Status: DC
  Administered 2018-04-06 – 2018-04-10 (×24): 250 mL

## 2018-04-06 MED ORDER — INSULIN REGULAR(HUMAN) IN NACL 100-0.9 UT/100ML-% IV SOLN
INTRAVENOUS | Status: AC
  Administered 2018-04-06 – 2018-04-07 (×2): 1.7 [IU]/h via INTRAVENOUS
  Filled 2018-04-06: qty 100

## 2018-04-06 NOTE — Progress Notes (Addendum)
Subjective: No significant changes. Nurses report waxing/waning in his degree of responsiveness to pain. Apparently he occasionally will have left eye deviation.   Exam: Vitals:   04/06/18 0500 04/06/18 0600  BP:  (!) 164/73  Pulse: 89 86  Resp: 20 (!) 21  Temp:    SpO2: 99% 97%   Gen: In bed, NAD Resp: non-labored breathing, no acute distress Abd: soft, nt  Neuro: MS: Opens eyes spontaneously,  CN:R pupil slightly oval shaped, eyes are midline, with doll's his eyes cross midline and then stay there, he does not fixate or track.  Motor: slight flexion x 4 Sensory:as above.   Pertinent Labs: Ammonia 22  Impression: 68 year old male initially presenting for abdominal pain and shortness of breath then suffering cardiac arrest 3/15 with downtime approximately 6 minutes.   With normal MRI and intact brainstem, I do not think that we can rule out the possibility that he may have some improvement over time.  I do think, however that there is some continued concern for hypoxic injury.   I think that with a waxing/waning exam, ruling out intermittent seizures as contributory to his mental status would be appropriate.   Recommendations: 1) overnight EEG 2) will discuss with family tomorrow, 7 days from arrest.   This patient is critically ill and at significant risk of neurological worsening, death and care requires constant monitoring of vital signs, hemodynamics,respiratory and cardiac monitoring, neurological assessment, discussion with family, other specialists and medical decision making of high complexity. I spent 40 minutes of neurocritical care time  in the care of  this patient. This was time spent independent of any time provided by nurse practitioner or PA.  Roland Rack, MD [facility] [phone]  If 7pm- 7am, please page neurology on call as listed in [place]. 04/06/2018  7:50 AM

## 2018-04-06 NOTE — Progress Notes (Signed)
RT note: Patient placed back on full support ventilation due to patient having a respiratory rate beginning to increase to the 30s and patient also getting hooked up to EEG.  Also decreased respiratory rate from 20 to 16 per MD order.  Patient tolerating well.  Will continue to monitor.

## 2018-04-06 NOTE — Progress Notes (Signed)
ANTICOAGULATION CONSULT NOTE   Pharmacy Consult for heparin Indication: atrial fibrillation  No Known Allergies  Patient Measurements: Height: 6' (182.9 cm) Weight: 237 lb 14 oz (107.9 kg) IBW/kg (Calculated) : 77.6 Heparin Dosing Weight: 100.5 kg  Vital Signs: Temp: 99.1 F (37.3 C) (03/21 0700) Temp Source: Oral (03/21 0357) BP: 141/75 (03/21 0804) Pulse Rate: 94 (03/21 0804)  Labs: Recent Labs    04/04/18 0322  04/05/18 0514 04/05/18 1325 04/05/18 1809 04/06/18 0329  HGB 12.3*  --  13.5  --   --  14.0  HCT 41.8  --  45.7  --   --  48.1  PLT 129*  --  145*  --   --  201  HEPARINUNFRC  --   --   --   --  0.44 0.41  CREATININE 0.94   < > 1.38* 1.23  --  1.31*  CKTOTAL  --   --   --  74  --   --    < > = values in this interval not displayed.    Estimated Creatinine Clearance: 70.4 mL/min (A) (by C-G formula based on SCr of 1.31 mg/dL (H)).   Medical History: Past Medical History:  Diagnosis Date  . Atrial fibrillation ([place])    a. persistent fib/flutter; rate controlled. refuses ablation or DCCV  . Chronic anticoagulation    Xarelto  . Coronary atherosclerosis of unspecified type of vessel, native or graft   . Diabetes mellitus, type II ([place])   . Diabetic neuropathy ([place])   . Hyperlipidemia   . Hypertension   . Non compliance w medication regimen    Per his mother  . Obese   . 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 Peripheral vascular disease ([place])   . Thrombocytopenia ([facility])     Medications:  Scheduled:  . chlorhexidine gluconate (MEDLINE KIT)  15 mL Mouth Rinse BID  . Chlorhexidine Gluconate Cloth  6 each Topical Daily  . feeding supplement (PRO-STAT SUGAR FREE 64)  30 mL Per Tube QID  . free water  200 mL Per Tube Q4H  . furosemide  40 mg Intravenous Daily  . hydrALAZINE  10 mg Oral Q8H  . insulin aspart  2-6 Units Subcutaneous Q4H  . insulin aspart  4 Units Subcutaneous Q4H  . insulin detemir  12 Units Subcutaneous Q12H  . lactulose  10 g  Oral TID  . mouth rinse  15 mL Mouth Rinse 10 times per day  . metoprolol tartrate  50 mg Oral BID  . nystatin cream   Topical BID  . pantoprazole sodium  40 mg Per Tube Daily  . potassium chloride  30 mEq Per Tube Q4H  . potassium chloride  40 mEq Per Tube Daily  . sodium chloride flush  10-40 mL Intracatheter Q12H    Assessment: 32 yom found to have metapneumovirus s/p cardiac arrest. Has prior history of Afib noted with questionable use of Xarelto PTA (listed as not taking PTA - adherence questions).  Was previously on [facility] for DVT ppx - now transition to heparin infusion given remains in rate-controlled Afib on metoprolol and diltiazem infusion.   Heparin therapeutic at 0.41 on 1400 units/hr. No issues noted.   Goal of Therapy:  Heparin level 0.3-0.7 units/ml Monitor platelets by anticoagulation protocol: Yes   Plan:  Continue heparin infusion at 1400 units/hr Check anti-Xa level daily while on heparin Continue to monitor H&H and platelets   Claiborne Billings, PharmD PGY2 Cardiology Pharmacy Resident Please check  AMION for all Pharmacist numbers by unit 04/06/2018 8:49 AM

## 2018-04-06 NOTE — Progress Notes (Signed)
vLTM EEG running. Tested event button. No skin breakdown. Notified neuro

## 2018-04-06 NOTE — Progress Notes (Signed)
NAME:  Samuel Mccoy, MRN:  [id number], DOB:  [birth year], LOS: 6 ADMISSION DATE:  04/11/2018, CONSULTATION DATE:  03/18/2018 REFERRING MD:  Colin Rhein [facility] ED CHIEF COMPLAINT:  Abdominal pain  Brief History   68 year old male initially presented with weakness, dyspnea and abdominal pain to [facility]. Cardiac arrest on 3/15 requiring intubation.  Found to be metapneumovirus positive.   HPI  68 year old male with schizophrenia and diabetes who presented to [place] on 3/13 for abdominal pain and hypoxemia. Admission labs with WBC 7.6 with N 72% and L 12% (abs 0.91). ABG 7.4/42/68. Trop 0.02. Influenza A/B negative. CT A/P with bilateral pleural effusions, small ascites but otherwise no evidence of acute abdominal abnormalities. On admission, he required 4L  however became hypoxemic on 3/14 requiring face mask and then BiPAP.   Sister provides additional history. Prior to admission, patient developed severe dyspnea. Unsure if he had fevers or chills. He has chronic abdominal pain that has been evaluated in the past. Denies hx of respiratory illness or recent travel. Denies direct sick contacts however expresses concern that she has taken her brother to several doctor visits in the in the last month related to his diabetic ulcer wound and has been exposed to waiting rooms with patients who had respiratory symptoms.  Past Medical History  Atrial fibrillation, chronic anticoagulation, CAD, DM2, HTN, HLD, schizophrenia  Significant Hospital Events   3/14 Transferred from [place] 3/15 Intubated 3/21 Remains on heparin gtt, diltiazem, weaning PSV 12/5   Consults:  PCCM  Procedures:  ETT 3/15 >>  Significant Diagnostic Tests:  CT A/P >> Consistent with cirrhosis with a small amount of ascites. Bilateral pleural effusions. Generalized edema.  No evidence of bowel obstruction. TTE 3/14 >> EF 60-65%. No WMA. Mild concentric LVH CT head 3/16 >> no acute process. EEG 3/17 >> no seizure  activity.  Generalized encephalopathy. MRI 3/17 >> no acute abnormality, chronic small vessel disease  Micro Data:  RVP 3/16 >> metapneumovirus positive Sputum 3/16 >>   Antimicrobials:  Ceftriaxone 3/14 >> 3/18 Azithro 3/14 >> 3/18  Interim history/subjective:  Remains on diltiazem, heparin gtt's.  Weaning on PSV.   Objective   Blood pressure (!) 141/75, pulse 94, temperature 99.1 F (37.3 C), resp. rate (!) 25, height 6' (1.829 m), weight 107.9 kg, SpO2 97 %. CVP:  [11 mmHg] 11 mmHg  Vent Mode: PSV;CPAP FiO2 (%):  [40 %] 40 % Set Rate:  [20 bmp] 20 bmp Vt Set:  [976 mL] 620 mL PEEP:  [5 cmH20] 5 cmH20 Pressure Support:  [12 cmH20] 12 cmH20 Plateau Pressure:  [19 cmH20-21 cmH20] 20 cmH20   Intake/Output Summary (Last 24 hours) at 04/06/2018 7341 Last data filed at 04/06/2018 0600 Gross per 24 hour  Intake 2374.25 ml  Output 3075 ml  Net -700.75 ml   Filed Weights   04/04/18 0222 04/05/18 0241 04/06/18 0500  Weight: 112 kg 108.6 kg 107.9 kg    Physical Exam: General: adult male lying in bed on vent, NAD HEENT: MM pink/moist, ETT Neuro: opens eyes to voice, does not track, no follow commands CV: s1s2 rrr, no m/r/g PULM: even/non-labored, lungs bilaterally coarse  PF:XTKW, non-tender, bsx4 active  Extremities: warm/dry, trace generalized edema  Skin: no rashes or lesions  Assessment & Plan:   S/p Cardiac Arrest  -likely secondary to hypoxemia P: ICU monitoring  Await neuro exam / prognositication  Appreciate Neurology input, may get repeat MRI later in week   Acute  Hypoxemic / Hypercarbic Respiratory Failure secondary to CAP, Metapneumovirus, cardiac arrest, ?flash pulmonary edema  TTE normal. Evidence of volume overload on BNP and exam. Discussed case with ID to consider COVID-19 testing however patient has alternative diagnosis and does not have travel exposure or positive patient contact to be considered for testing.  P: PRVC 8 cc/kg as rest mode  Wean  PEEP / FiO2 for sats > 90% PSV wean as tolerated  Not a candidate for extubation due to mental status  Completed abx for CAP   Repeat lasix 40 mg IV x1 3/21 + KCL  Acute Encephalopathy  Head CT, MR Brain and EEG negative. Cirrhosis on CT - Unclear etiology. No evidence of ascites on [place]. OSH ammonia 9.0 P: Neurology following, appreciate input  Defer further neuro prognostication work up to Neurology  Address hypernatremia, follow ammonia   Hx of schizophrenia.  -reportedly agitated on admit; however unresponsive since 3/17 P: Supportive care, await neuro exam   AKI  P: Trend BMP / urinary output Replace electrolytes as indicated Avoid nephrotoxic agents, ensure adequate renal perfusion  Atrial fibrillation -on diltiazem at home P: IV heparin gtt  Lopressor PT  Cardizem gtt   HTN P: Lopressor PT   DM2 RLE Diabetic Ulcer P: SSI, standard scale Levemir 12 units BID  4 units meal coverage  WOC following, appreciate input    Best practice:  Diet: NPO Pain/Anxiety/Delirium protocol (if indicated): Off all sedation secondary to altered mental status VAP protocol (if indicated): Yes DVT prophylaxis: SQ heparin GI prophylaxis: PPI daily Glucose control: CBG q4h, SSI Mobility: BR Code Status: Full code Family Communication: 3/21 am no family available on NP rounds Disposition: ICU   CC Time:  [place], NP-C [place] Pgr: [phone] or if no answer [phone] 04/06/2018, 8:52 AM

## 2018-04-06 NOTE — Progress Notes (Signed)
[facility] ADULT ICU REPLACEMENT PROTOCOL FOR AM LAB REPLACEMENT ONLY  The patient does apply for the [facility] Adult ICU Electrolyte Replacment Protocol based on the criteria listed below:   1. Is GFR >/= 40 ml/min? Yes.    Patient's GFR today is 56 2. Is urine output >/= 0.5 ml/kg/hr for the last 6 hours? Yes.   Patient's UOP is 1.3 ml/kg/hr 3. Is BUN < 60 mg/dL? Yes.    Patient's BUN today is 46 4. Abnormal electrolyte(s): K-3.2 5. Ordered repletion with: per protocol 6. If a panic level lab has been reported, has the CCM MD in charge been notified? Yes.  .   Physician:  Dr. Durward Parcel, Philis [place] 04/06/2018 5:25 AM

## 2018-04-06 NOTE — Progress Notes (Signed)
RT note: patient placed on CPAP/PSV of 12/5 at 0800.  Currently tolerating well.  Will continue to monitor and wean as tolerated.

## 2018-04-07 ENCOUNTER — Inpatient Hospital Stay (HOSPITAL_COMMUNITY): Payer: Medicare Other

## 2018-04-07 DIAGNOSIS — G931 Anoxic brain damage, not elsewhere classified: Secondary | ICD-10-CM

## 2018-04-07 DIAGNOSIS — N179 Acute kidney failure, unspecified: Secondary | ICD-10-CM

## 2018-04-07 LAB — BASIC METABOLIC PANEL
Anion gap: 7 (ref 5–15)
BUN: 53 mg/dL — ABNORMAL HIGH (ref 8–23)
CO2: 31 mmol/L (ref 22–32)
Calcium: 8.4 mg/dL — ABNORMAL LOW (ref 8.9–10.3)
Chloride: 111 mmol/L (ref 98–111)
Creatinine, Ser: 1.4 mg/dL — ABNORMAL HIGH (ref 0.61–1.24)
GFR calc Af Amer: >60 mL/min (ref >60)
GFR calc non Af Amer: 52 mL/min — ABNORMAL LOW (ref >60)
Glucose, Bld: 190 mg/dL — ABNORMAL HIGH (ref 70–99)
POTASSIUM: 3.3 mmol/L — AB (ref 3.5–5.1)
Sodium: 149 mmol/L — ABNORMAL HIGH (ref 135–145)

## 2018-04-07 LAB — GLUCOSE, CAPILLARY
GLUCOSE-CAPILLARY: 172 mg/dL — AB (ref 70–99)
Glucose-Capillary: 222 mg/dL — ABNORMAL HIGH (ref 70–99)
Glucose-Capillary: 216 mg/dL — ABNORMAL HIGH (ref 70–99)
Glucose-Capillary: 199 mg/dL — ABNORMAL HIGH (ref 70–99)
Glucose-Capillary: 157 mg/dL — ABNORMAL HIGH (ref 70–99)
Glucose-Capillary: 156 mg/dL — ABNORMAL HIGH (ref 70–99)
Glucose-Capillary: 152 mg/dL — ABNORMAL HIGH (ref 70–99)
Glucose-Capillary: 144 mg/dL — ABNORMAL HIGH (ref 70–99)
Glucose-Capillary: 145 mg/dL — ABNORMAL HIGH (ref 70–99)
Glucose-Capillary: 156 mg/dL — ABNORMAL HIGH (ref 70–99)
Glucose-Capillary: 165 mg/dL — ABNORMAL HIGH (ref 70–99)

## 2018-04-07 LAB — CBC
HCT: 43.4 % (ref 39.0–52.0)
Hemoglobin: 12.6 g/dL — ABNORMAL LOW (ref 13.0–17.0)
MCH: 23 pg — ABNORMAL LOW (ref 26.0–34.0)
MCHC: 29 g/dL — ABNORMAL LOW (ref 30.0–36.0)
MCV: 79.2 fL — AB (ref 80.0–100.0)
Platelets: 190 10*3/uL (ref 150–400)
RBC: 5.48 MIL/uL (ref 4.22–5.81)
RDW: 20.7 % — ABNORMAL HIGH (ref 11.5–15.5)
WBC: 15.1 10*3/uL — ABNORMAL HIGH (ref 4.0–10.5)
nRBC: 0 % (ref 0.0–0.2)

## 2018-04-07 LAB — MAGNESIUM: Magnesium: 1.9 mg/dL (ref 1.7–2.4)

## 2018-04-07 LAB — PHOSPHORUS: Phosphorus: 2.5 mg/dL (ref 2.5–4.6)

## 2018-04-07 LAB — HEPARIN LEVEL (UNFRACTIONATED): Heparin Unfractionated: 0.33 IU/mL (ref 0.30–0.70)

## 2018-04-07 MED ORDER — POTASSIUM CHLORIDE 20 MEQ/15ML (10%) PO SOLN
40.0000 meq | Freq: Two times a day (BID) | ORAL | Status: DC
  Administered 2018-04-08 – 2018-04-09 (×4): 40 meq
  Filled 2018-04-07 (×4): qty 30

## 2018-04-07 MED ORDER — INSULIN ASPART 100 UNIT/ML ~~LOC~~ SOLN
0.0000 [IU] | SUBCUTANEOUS | Status: DC
  Administered 2018-04-07: 3 [IU] via SUBCUTANEOUS
  Administered 2018-04-07: 2 [IU] via SUBCUTANEOUS
  Administered 2018-04-07 – 2018-04-08 (×5): 3 [IU] via SUBCUTANEOUS
  Administered 2018-04-08: 5 [IU] via SUBCUTANEOUS
  Administered 2018-04-08: 3 [IU] via SUBCUTANEOUS
  Administered 2018-04-09: 2 [IU] via SUBCUTANEOUS
  Administered 2018-04-09 (×2): 3 [IU] via SUBCUTANEOUS
  Administered 2018-04-09 (×2): 5 [IU] via SUBCUTANEOUS
  Administered 2018-04-09: 3 [IU] via SUBCUTANEOUS
  Administered 2018-04-09: 5 [IU] via SUBCUTANEOUS
  Administered 2018-04-10 (×2): 2 [IU] via SUBCUTANEOUS

## 2018-04-07 MED ORDER — DILTIAZEM 12 MG/ML ORAL SUSPENSION
60.0000 mg | Freq: Four times a day (QID) | ORAL | Status: DC
  Administered 2018-04-07 – 2018-04-10 (×11): 60 mg
  Filled 2018-04-07 (×13): qty 6

## 2018-04-07 MED ORDER — INSULIN DETEMIR 100 UNIT/ML ~~LOC~~ SOLN
14.0000 [IU] | Freq: Two times a day (BID) | SUBCUTANEOUS | Status: DC
  Administered 2018-04-07 – 2018-04-08 (×3): 14 [IU] via SUBCUTANEOUS
  Filled 2018-04-07 (×3): qty 0.14

## 2018-04-07 MED ORDER — INSULIN ASPART 100 UNIT/ML ~~LOC~~ SOLN
5.0000 [IU] | SUBCUTANEOUS | Status: DC
  Administered 2018-04-07 – 2018-04-10 (×17): 5 [IU] via SUBCUTANEOUS

## 2018-04-07 MED ORDER — LACTULOSE 10 GM/15ML PO SOLN
10.0000 g | Freq: Two times a day (BID) | ORAL | Status: DC
  Administered 2018-04-07: 10 g
  Filled 2018-04-07: qty 15

## 2018-04-07 MED ORDER — POTASSIUM CHLORIDE 20 MEQ/15ML (10%) PO SOLN
40.0000 meq | Freq: Once | ORAL | Status: AC
  Administered 2018-04-07: 40 meq
  Filled 2018-04-07: qty 30

## 2018-04-07 NOTE — Progress Notes (Signed)
[facility] Progress Note Patient Name: MRK BUZBY DOB: Apr 28, 1950 MRN: [id number]   Date of Service  04/07/2018  HPI/Events of Note  K+ = 3.3 and Creatinine = 1.4.   eICU Interventions  Will replace K+.      Intervention Category Major Interventions: Electrolyte abnormality - evaluation and management  Sommer,Steven [place] 04/07/2018, 6:05 AM

## 2018-04-07 NOTE — Procedures (Signed)
LTM-EEG Report  HISTORY: Continuous video-EEG monitoring performed for 68 year old with AMS  ACQUISITION: International 10-20 system for electrode placement; 18 channels with additional eyes linked to ipsilateral ears and EKG. Additional T1-T2 electrodes were used. Continuous video recording obtained.   EEG NUMBER:  MEDICATIONS:  Day 1: see EMR   DAY #1: from 1151 04/06/18 to 0730 04/07/18   BACKGROUND: An overall medium voltage continuous recording with poor spontaneous variability and reactivity. The waking background consisted of medium voltage 3-6Hz  activity without clear evidence of a posterior dominant rhythm. There was increased delta range slowing in sleep but without clear sleep architecture.  EPILEPTIFORM/PERIODIC ACTIVITY: some short runs of GRDA without ictal evolution, increased in sleep. SEIZURES: none EVENTS: none reported  EKG: no significant arrhythmia  SUMMARY: This was an abnormal continuous video EEG due to diffuse slowing and loss of normal background, indicative of a non-specific encephalopathy pattern. Some short runs of GRDA were seen without particular epileptiform appearance.

## 2018-04-07 NOTE — Progress Notes (Signed)
[place] for heparin Indication: atrial fibrillation  No Known Allergies  Patient Measurements: Height: 6' (182.9 cm) Weight: 227 lb 4.7 oz (103.1 kg) IBW/kg (Calculated) : 77.6 Heparin Dosing Weight: 100.5 kg  Vital Signs: Temp: 99.4 F (37.4 C) (03/22 0300) Temp Source: Oral (03/22 0300) BP: 152/73 (03/22 0600) Pulse Rate: 83 (03/22 0600)  Labs: Recent Labs    04/05/18 0514 04/05/18 1325 04/05/18 1809 04/06/18 0329 04/07/18 0415  HGB 13.5  --   --  14.0 12.6*  HCT 45.7  --   --  48.1 43.4  PLT 145*  --   --  201 190  HEPARINUNFRC  --   --  0.44 0.41 0.33  CREATININE 1.38* 1.23  --  1.31* 1.40*  CKTOTAL  --  74  --   --   --     Estimated Creatinine Clearance: 64.5 mL/min (A) (by C-G formula based on SCr of 1.4 mg/dL (H)).   Medical History: Past Medical History:  Diagnosis Date  . Atrial fibrillation ([place])    a. persistent fib/flutter; rate controlled. refuses ablation or DCCV  . Chronic anticoagulation    Xarelto  . Coronary atherosclerosis of unspecified type of vessel, native or graft   . Diabetes mellitus, type II ([place])   . Diabetic neuropathy ([place])   . Hyperlipidemia   . Hypertension   . Non compliance w medication regimen    Per his mother  . Obese   . 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 Peripheral vascular disease ([place])   . Thrombocytopenia (H[facility])     Medications:  Scheduled:  . chlorhexidine gluconate (MEDLINE KIT)  15 mL Mouth Rinse BID  . Chlorhexidine Gluconate Cloth  6 each Topical Daily  . feeding supplement (PRO-STAT SUGAR FREE 64)  30 mL Per Tube QID  . free water  250 mL Per Tube Q4H  . hydrALAZINE  10 mg Oral Q8H  . lactulose  10 g Oral TID  . mouth rinse  15 mL Mouth Rinse 10 times per day  . metoprolol tartrate  50 mg Oral BID  . nystatin cream   Topical BID  . pantoprazole sodium  40 mg Per Tube Daily  . potassium chloride  40 mEq Per Tube Daily  . sodium chloride flush  10-40  mL Intracatheter Q12H    Assessment: 33 yom found to have metapneumovirus s/p cardiac arrest. Has prior history of Afib noted with questionable use of Xarelto PTA (listed as not taking PTA - adherence questions).  Was previously on [facility] for DVT ppx - now transition to heparin infusion given remains in rate-controlled Afib on metoprolol and diltiazem infusion.   Heparin remains therapeutic at 0.33 on 1400 units/hr. Hgb down. No signs/symptoms of bleeding or issues with infusion reported by nursing.  Goal of Therapy:  Heparin level 0.3-0.7 units/ml Monitor platelets by anticoagulation protocol: Yes   Plan:  Continue heparin infusion at 1400 units/hr Check anti-Xa level daily while on heparin Continue to monitor H&H and platelets   Claiborne Billings, PharmD PGY2 Cardiology Pharmacy Resident Please check AMION for all Pharmacist numbers by unit 04/07/2018 7:30 AM

## 2018-04-07 NOTE — Progress Notes (Signed)
ET tube advanced, per order, from 21 cm to 22 cm.  Measured @ lip.

## 2018-04-07 NOTE — Progress Notes (Signed)
I discussed with Bary Leriche, the patient's sister what the patient's wishes would be.  She indicates that she and his brother have discussed situations like this in the past, they do not feel that he would want prolonged care such as tracheostomy and feeding tube.  Given that he has had some change since Monday and he had a normal MRI, I think giving him a few more days to demonstrate evidence of improvement would be reasonable.  I would also like to repeat his MRI.  In the interim, however, I discussed CODE STATUS with his sister and she indicates that in the event of cardiac arrest he would not want to be resuscitated.  Even prior to this most recent event, he was starting to have more trouble taking care of himself, though he did still live alone with his dog.  She took care of his finances, and he needed someone to come around a few times a week as he was able to to "do for himself."  And was scared of having to go into a facility which he had indicated he would not want.  At this time, I have placed the DNR and if he does not make dramatic improvements by middle of the week, then I think further discussions for withdrawal of care would need to be considered.  Roland Rack, MD [facility] [phone]  If 7pm- 7am, please page neurology on call as listed in [place].

## 2018-04-07 NOTE — Progress Notes (Signed)
68 year old with schizophrenia and diabetes admitted 3/13 for abdominal pain and hypoxia. Transferred from [facility] 3/14, respiratory viral panel positive for metapneumovirus, developed hypoxia, unfortunately had a cardiac arrest on 3/15  He has not woken up since then, initial MRI was negative, EEG negative he was treated empirically for hepatic encephalopathy  On exam-off sedation, does not follow commands, no purposeful movement, obese man, no distress, decreased breath sounds bilateral, minimal secretions, S1-S2 regular, sinus on monitor, 1+ edema  Chest x-ray personally reviewed which shows improving bibasilar infiltrates. Labs show stable hyponatremia, mild hypokalemia, creatinine 1.4, decreasing leukocytosis.  Impression/plan  Acute encephalopathy postarrest, although downtime was small most likely cause does appear to be hypoxic injury, EEG has been discontinued, neurology planning on repeat MRI Continue lactulose empiric but decrease dose  Acute respiratory failure with hypoxia-weaning on pressure support but poor mental status remains a barrier to extubation  AKI -recovering  Persistent hypokalemia-related to diarrhea, repleted Atrial fibrillation-transition to enteral Cardizem  Uncontrolled diabetes type 2-transition off insulin drip with Levemir 14 twice daily and add meal coverage.  After discussion with sister, DNR was issued with plan for withdrawal if he does not improve in another 2 to 3 days.  The patient is critically ill with multiple organ systems failure and requires high complexity decision making for assessment and support, frequent evaluation and titration of therapies, application of advanced monitoring technologies and extensive interpretation of multiple databases. Critical Care Time devoted to patient care services described in this note independent of APP/resident  time is 32 minutes.   Leanna Sato Elsworth Soho MD

## 2018-04-07 NOTE — Progress Notes (Signed)
Pt transported from 2H15 to MRI and back without incident.

## 2018-04-07 NOTE — Progress Notes (Signed)
NAME:  Samuel Mccoy, MRN:  [id number], DOB:  1950/06/24, LOS: 7 ADMISSION DATE:  04/09/2018, CONSULTATION DATE:  04/15/2018 REFERRING MD:  Colin Rhein [facility] ED CHIEF COMPLAINT:  Abdominal pain  Brief History   68 year old male initially presented with weakness, dyspnea and abdominal pain to [facility]. Cardiac arrest on 3/15 requiring intubation.  Found to be metapneumovirus positive.   HPI  68 year old male with schizophrenia and diabetes who presented to [facility] on 3/13 for abdominal pain and hypoxemia. Admission labs with WBC 7.6 with N 72% and L 12% (abs 0.91). ABG 7.4/42/68. Trop 0.02. Influenza A/B negative. CT A/P with bilateral pleural effusions, small ascites but otherwise no evidence of acute abdominal abnormalities. On admission, he required 4L [place] however became hypoxemic on 3/14 requiring face mask and then BiPAP.   Sister provides additional history. Prior to admission, patient developed severe dyspnea. Unsure if he had fevers or chills. He has chronic abdominal pain that has been evaluated in the past. Denies hx of respiratory illness or recent travel. Denies direct sick contacts however expresses concern that she has taken her brother to several doctor visits in the in the last month related to his diabetic ulcer wound and has been exposed to waiting rooms with patients who had respiratory symptoms.  Past Medical History  Atrial fibrillation, chronic anticoagulation, CAD, DM2, HTN, HLD, schizophrenia  Significant Hospital Events   3/14 Transferred from [facility] 3/15 Intubated 3/21 Remains on heparin gtt, diltiazem, weaning PSV 12/5 3/22 Continuous EEG in process   Consults:  PCCM  Procedures:  ETT 3/15 >>  Significant Diagnostic Tests:  CT A/P >> Consistent with cirrhosis with a small amount of ascites. Bilateral pleural effusions. Generalized edema.  No evidence of bowel obstruction. TTE 3/14 >> EF 60-65%. No WMA. Mild concentric LVH CT head 3/16 >> no acute  process. EEG 3/17 >> no seizure activity.  Generalized encephalopathy. MRI 3/17 >> no acute abnormality, chronic small vessel disease  Micro Data:  RVP 3/16 >> metapneumovirus positive Sputum 3/16 >> rare candida   Antimicrobials:  Ceftriaxone 3/14 >> 3/18 Azithro 3/14 >> 3/18  Interim history/subjective:  RN reports insulin gtt started overnight, persistent hypokalemia.  Remains on cardizem gtt.   Objective   Blood pressure (!) 152/73, pulse 83, temperature 99.4 F (37.4 C), temperature source Oral, resp. rate 17, height 6' (1.829 m), weight 103.1 kg, SpO2 96 %. CVP:  [11 mmHg] 11 mmHg  Vent Mode: PRVC FiO2 (%):  [40 %] 40 % Set Rate:  [16 bmp] 16 bmp Vt Set:  [620 mL] 620 mL PEEP:  [5 cmH20] 5 cmH20 Pressure Support:  [12 cmH20] 12 cmH20 Plateau Pressure:  [17 cmH20-19 cmH20] 18 cmH20   Intake/Output Summary (Last 24 hours) at 04/07/2018 0731 Last data filed at 04/07/2018 0600 Gross per 24 hour  Intake 2084.13 ml  Output 3050 ml  Net -965.87 ml   Filed Weights   04/05/18 0241 04/06/18 0500 04/07/18 0500  Weight: 108.6 kg 107.9 kg 103.1 kg    Physical Exam: General: ill appearing adult male lying in bed on vent HEENT: MM pink/moist, ETT, no jvd, EEG leads in place Neuro: eyes closed on entering room, called patients name and he lifted his head and turned toward provider, unable to reproduce on other side, no follow commands  CV: s1s2 rrr, no m/r/g PULM: even/non-labored, lungs bilaterally clear  NL:ZJQB, non-tender, bsx4 active  Extremities: warm/dry, 1-2+ generalized edema, R foot wrapped in gauze Skin:  no rashes or lesions  Assessment & Plan:   S/p Cardiac Arrest  -likely secondary to hypoxemia P: ICU monitoring  Follow  hemodynamics  Acute Hypoxemic / Hypercarbic Respiratory Failure secondary to CAP, Metapneumovirus, cardiac arrest, ?flash pulmonary edema  TTE normal. Evidence of volume overload on BNP and exam. Discussed case with ID to consider COVID-19  testing however patient has alternative diagnosis and does not have travel exposure or positive patient contact to be considered for testing.  P: PRVC 8cc/kg  Wean PEEP / fiO2 for sats >90% PSV wean as tolerated but mental status remains barrier to extubation  Completed abx for CAP  Hold lasix 3/22   Acute Encephalopathy  -Head CT, MR Brain and EEG negative. -Cirrhosis on CT - Unclear etiology. No evidence of ascites on [place]. OSH ammonia 9.0 P: [facility] input, await further neuro prognostication  Plan for repeat MRI later in week, defer timing to Neuro  Hx of schizophrenia.  -reportedly agitated on admit; however unresponsive since 3/17 P: Supportive care  AKI  Hypokalemia P: Trend BMP / urinary output Replace electrolytes as indicated Add BID 40 mEq KCL starting in am 3/23 Avoid nephrotoxic agents, ensure adequate renal perfusion  Atrial fibrillation -on diltiazem at home P: Continue heparin gtt   Transition to cardizem PT, stop time in for cardizem gtt Continue lopressor  HTN P: Continue lopressor, hydralazine   DM2 RLE Diabetic Ulcer P: Discontinue insulin gtt  SSI, moderate scale with Q4 CBG Add levemir 14 units BID  5 units meal coverage  WOC following for R foot  Cirrhosis on CT  -unclear etiology, no ascites on [place]  P: Follow ammonia PRN Continue lactulose   Best practice:  Diet: NPO Pain/Anxiety/Delirium protocol (if indicated): Off all sedation given mental status VAP protocol (if indicated): Yes DVT prophylaxis: SQ heparin GI prophylaxis: PPI daily Glucose control: CBG q4h, SSI Mobility: BR Code Status: Full code Family Communication: No family available on NP rounds 3/22 Disposition: ICU   CC Time:  44 minutes  Noe Gens, NP-C [facility] Pgr: [phone] or if no answer [phone] 04/07/2018, 7:31 AM

## 2018-04-07 NOTE — Progress Notes (Addendum)
Subjective: No significant changes. No seizures on EEG.   Exam: Vitals:   04/07/18 0735 04/07/18 0800  BP:    Pulse:  83  Resp:  16  Temp: 98.1 F (36.7 C)   SpO2:  97%   Gen: In bed, intubated Resp: ventilated.  Abd: soft, nt  Neuro: MS: Opens eyes spontaneously and to loud noise.  CN: R pupil slightly oval shaped, both are reactive, corneals intact, eyes are midline, with doll's his eyes cross midline and then stay there, he does not fixate or track. Does not blink to threat Motor: slight flexion in BLE, extensor in BUE Sensory:as above.   Labs: Na 149  Impression: 68 year old male initially presenting for abdominal pain and shortness of breath then suffering cardiac arrest 3/15 with downtime approximately 6 minutes. He has not had any significant improvement over the past 7 days. I would favor repeat MRI given that the last one was normal, there may be some delayed change. No evidence of other causes of encephalopathy, e.g. normal ammonia and no response to treatment of hepatic encephalopathy, no elevation of CK to suggest neuroleptic malignant syndrome(also not on neuroleptics, just has history of schizophrenia), normal TSH, so unlikely to be myxedema coma.   With the timing after arrest, I think that even with normal MRI, hypoxic brain injury remains the most likely explanation.   Recommendations: 1) repeat MRI brain 2) d/c eeg 3) will discuss with family  This patient is critically ill and at significant risk of neurological worsening, death and care requires constant monitoring of vital signs, hemodynamics,respiratory and cardiac monitoring, neurological assessment, discussion with family, other specialists and medical decision making of high complexity. I spent 40 minutes of neurocritical care time  in the care of  this patient. This was time spent independent of any time provided by nurse practitioner or PA.  Roland Rack, MD [facility] [phone]  If 7pm- 7am, please page neurology on call as listed in [place]. 04/07/2018  8:22 AM

## 2018-04-08 DIAGNOSIS — Z7189 Other specified counseling: Secondary | ICD-10-CM

## 2018-04-08 DIAGNOSIS — Z515 Encounter for palliative care: Secondary | ICD-10-CM

## 2018-04-08 LAB — CBC
HCT: 43.5 % (ref 39.0–52.0)
Hemoglobin: 12 g/dL — ABNORMAL LOW (ref 13.0–17.0)
MCH: 22 pg — AB (ref 26.0–34.0)
MCHC: 27.6 g/dL — ABNORMAL LOW (ref 30.0–36.0)
MCV: 79.7 fL — ABNORMAL LOW (ref 80.0–100.0)
Platelets: 216 10*3/uL (ref 150–400)
RBC: 5.46 MIL/uL (ref 4.22–5.81)
RDW: 21 % — ABNORMAL HIGH (ref 11.5–15.5)
WBC: 17.8 10*3/uL — ABNORMAL HIGH (ref 4.0–10.5)
nRBC: 0 % (ref 0.0–0.2)

## 2018-04-08 LAB — BASIC METABOLIC PANEL
Anion gap: 8 (ref 5–15)
BUN: 57 mg/dL — AB (ref 8–23)
CO2: 29 mmol/L (ref 22–32)
CREATININE: 1.42 mg/dL — AB (ref 0.61–1.24)
Calcium: 8.8 mg/dL — ABNORMAL LOW (ref 8.9–10.3)
Chloride: 113 mmol/L — ABNORMAL HIGH (ref 98–111)
GFR calc Af Amer: 59 mL/min — ABNORMAL LOW (ref >60)
GFR calc non Af Amer: 51 mL/min — ABNORMAL LOW (ref >60)
Glucose, Bld: 194 mg/dL — ABNORMAL HIGH (ref 70–99)
Potassium: 3.7 mmol/L (ref 3.5–5.1)
Sodium: 150 mmol/L — ABNORMAL HIGH (ref 135–145)

## 2018-04-08 LAB — GLUCOSE, CAPILLARY
Glucose-Capillary: 171 mg/dL — ABNORMAL HIGH (ref 70–99)
Glucose-Capillary: 150 mg/dL — ABNORMAL HIGH (ref 70–99)
Glucose-Capillary: 155 mg/dL — ABNORMAL HIGH (ref 70–99)
Glucose-Capillary: 165 mg/dL — ABNORMAL HIGH (ref 70–99)
Glucose-Capillary: 180 mg/dL — ABNORMAL HIGH (ref 70–99)
Glucose-Capillary: 176 mg/dL — ABNORMAL HIGH (ref 70–99)
Glucose-Capillary: 182 mg/dL — ABNORMAL HIGH (ref 70–99)
Glucose-Capillary: 187 mg/dL — ABNORMAL HIGH (ref 70–99)
Glucose-Capillary: 217 mg/dL — ABNORMAL HIGH (ref 70–99)

## 2018-04-08 LAB — HEPARIN LEVEL (UNFRACTIONATED)
Heparin Unfractionated: 0.17 IU/mL — ABNORMAL LOW (ref 0.30–0.70)
Heparin Unfractionated: 0.32 IU/mL (ref 0.30–0.70)

## 2018-04-08 LAB — PHOSPHORUS: Phosphorus: 2.9 mg/dL (ref 2.5–4.6)

## 2018-04-08 LAB — MAGNESIUM: Magnesium: 1.9 mg/dL (ref 1.7–2.4)

## 2018-04-08 MED ORDER — INSULIN DETEMIR 100 UNIT/ML ~~LOC~~ SOLN
17.0000 [IU] | Freq: Two times a day (BID) | SUBCUTANEOUS | Status: DC
  Administered 2018-04-08 – 2018-04-09 (×2): 17 [IU] via SUBCUTANEOUS
  Filled 2018-04-08 (×2): qty 0.17

## 2018-04-08 MED ORDER — METOPROLOL TARTRATE 50 MG PO TABS
100.0000 mg | ORAL_TABLET | Freq: Two times a day (BID) | ORAL | Status: DC
  Administered 2018-04-08 – 2018-04-09 (×4): 100 mg
  Filled 2018-04-08 (×4): qty 2

## 2018-04-08 MED ORDER — METOPROLOL TARTRATE 50 MG PO TABS
50.0000 mg | ORAL_TABLET | Freq: Once | ORAL | Status: AC
  Administered 2018-04-08: 50 mg
  Filled 2018-04-08: qty 1

## 2018-04-08 NOTE — Progress Notes (Signed)
ANTICOAGULATION CONSULT NOTE - Follow Up Consult  Pharmacy Consult for heparin Indication: atrial fibrillation  Labs: Recent Labs    04/05/18 1325  04/06/18 0329 04/07/18 0415 04/08/18 0354  HGB  --   --  14.0 12.6* 12.0*  HCT  --   --  48.1 43.4 43.5  PLT  --   --  201 190 216  HEPARINUNFRC  --    < > 0.41 0.33 0.17*  CREATININE 1.23  --  1.31* 1.40*  --   CKTOTAL 74  --   --   --   --    < > = values in this interval not displayed.    Assessment: 68yo male subtherapeutic on heparin after several levels at goal though levels had been steadily trending down; no gtt issues or signs of bleeding per RN.  Goal of Therapy:  Heparin level 0.3-0.7 units/ml   Plan:  Will increase heparin gtt by 3 units/kg/hr to 1700 units/hr and check level in 6 hours.    Wynona Neat, PharmD, BCPS  04/08/2018,5:01 AM

## 2018-04-08 NOTE — Progress Notes (Signed)
eLink Physician-Brief Progress Note Patient Name: ESTUARDO FRISBEE DOB: Nov 29, 1950 MRN: [id number]   Date of Service  04/08/2018  HPI/Events of Note  AFIB with RVR - Ventricular rate = 115.  BP  = 158/71  eICU Interventions  Will order: 1. Metoprolol 50 mg per tube now.  2. Increase Metoprolol dose to 100 mg per tube BID.     Intervention Category Major Interventions: Arrhythmia - evaluation and management  Sommer,Steven Cornelia Copa 04/08/2018, 3:53 AM

## 2018-04-08 NOTE — Progress Notes (Signed)
ANTICOAGULATION CONSULT NOTE - Follow Up Consult  Pharmacy Consult for heparin Indication: atrial fibrillation  Labs: Recent Labs    04/05/18 1325  04/06/18 0329 04/07/18 0415 04/08/18 0354  HGB  --    < > 14.0 12.6* 12.0*  HCT  --   --  48.1 43.4 43.5  PLT  --   --  201 190 216  HEPARINUNFRC  --    < > 0.41 0.33 0.17*  CREATININE 1.23  --  1.31* 1.40* 1.42*  CKTOTAL 74  --   --   --   --    < > = values in this interval not displayed.    Assessment: 69yo male with history of atrial fibrillation currently on heparin. Patient subtherapeutic on heparin after several levels at goal though levels had been steadily trending down; no gtt issues or signs of bleeding documented.   Heparin level 0.32, CBCs stable.   Goal of Therapy:  Heparin level 0.3-0.7 units/ml   Plan:  Continue heparin at 1700 units/hr Monitor daily heparin levels, CBCs, and s/sx of bleeding.     Gwenlyn Found, Sherian Rein D PGY1 Pharmacy Resident  Phone [phone] 04/08/2018   12:11 PM

## 2018-04-08 NOTE — Progress Notes (Addendum)
NAME:  Samuel Mccoy, MRN:  [id number], DOB:  Sep 14, 1950, LOS: 8 ADMISSION DATE:  04/04/2018, CONSULTATION DATE:  04/11/2018 REFERRING MD:  Colin Rhein [facility] ED CHIEF COMPLAINT:  Abdominal pain  Brief History   68 year old male initially presented with weakness, dyspnea and abdominal pain to [facility]. Cardiac arrest on 3/15 requiring intubation.  Found to be metapneumovirus positive.   Past Medical History  Atrial fibrillation, chronic anticoagulation, CAD, DM2, HTN, HLD, schizophrenia  Significant Hospital Events   3/14 Transferred from [place] 3/15 Intubated 3/21 Remains on heparin gtt, diltiazem, weaning PSV 12/5 3/22 Continuous EEG in process   Consults:  PCCM  Procedures:  ETT 3/15 >>  Significant Diagnostic Tests:  CT A/P >> Consistent with cirrhosis with a small amount of ascites. Bilateral pleural effusions. Generalized edema.  No evidence of bowel obstruction. TTE 3/14 >> EF 60-65%. No WMA. Mild concentric LVH CT head 3/16 >> no acute process. EEG 3/17 >> no seizure activity.  Generalized encephalopathy. MRI 3/17 >> no acute abnormality, chronic small vessel disease MRI 3/22 > consistent with hypoxic ischemic encephalopathy.  Micro Data:  RVP 3/16 >> metapneumovirus positive Sputum 3/16 >> rare candida   Antimicrobials:  Ceftriaxone 3/14 >> 3/18 Azithro 3/14 >> 3/18  Interim history/subjective:  No acute events overnight.   Objective   Blood pressure 120/64, pulse 90, temperature (!) 101.4 F (38.6 C), temperature source Axillary, resp. rate 17, height 6' (1.829 m), weight 103.1 kg, SpO2 97 %.    Vent Mode: PRVC FiO2 (%):  [40 %] 40 % Set Rate:  [16 bmp] 16 bmp Vt Set:  [620 mL] 620 mL PEEP:  [5 cmH20] 5 cmH20 Pressure Support:  [12 cmH20] 12 cmH20 Plateau Pressure:  [18 cmH20-21 cmH20] 20 cmH20   Intake/Output Summary (Last 24 hours) at 04/08/2018 1202 Last data filed at 04/08/2018 1200 Gross per 24 hour  Intake 3170.29 ml  Output 2200 ml  Net  970.29 ml   Filed Weights   04/07/18 0500 04/08/18 0000 04/08/18 0428  Weight: 103.1 kg 103.1 kg 103.1 kg    Physical Exam: General: adult male on vent HEENT: NA/AT, PERRL, no JVD.  Neuro: Unresponsive. Does not open eyes. No response to pain.  Gag and corneal reflex intact. CV: s1s2 rrr, no m/r/g PULM: even/non-labored, lungs bilaterally clear  AY:TKZS, non-tender, bsx4 active  Extremities: R foot dressing in place CDI.   Assessment & Plan:   S/p Cardiac Arrest - likely secondary to hypoxemia P: ICU monitoring  Follow  hemodynamics  Acute Hypoxemic / Hypercarbic Respiratory Failure secondary to CAP, Metapneumovirus, cardiac arrest, ?flash pulmonary edema. TTE normal. Evidence of volume overload on BNP and exam. Discussed case with ID to consider COVID-19 testing however patient has alternative diagnosis and does not have travel exposure or positive patient contact to be considered for testing.  P: Full vent support PSV wean as tolerated but mental status remains barrier to extubation  Holding diuresis.   Acute Encephalopathy  - Head CT, MR Brain and EEG negative initially, but now MRI on 3/22 consistent with hypoxemic injury. Neurology feels there is a very poor prognosis associated with his condition. -Cirrhosis on CT - Unclear etiology. OSH ammonia 9.0. Hepatic encephalopathy not felt to be playing a significant role. He is s/p several days of lactulose without clinical improvement.  P: Appreciate Neurology input Family has changed code status to DNR as a result of recent findings. They will ultimately elect to withdrawal care, but are unable to  arrange visitation until 3/25 due to recent illness. They would like [place] to continue supportive care until that time. No escalation.   Hx of schizophrenia.  - reportedly agitated on admit; however, unresponsive since 3/17 P: Supportive care  AKI  Hypokalemia Hypernatremia P: Trend BMP / urinary output Replace electrolytes as  indicated Add BID 40 mEq KCL starting in am 3/23 Free water continue. NA went up with lactulose, now DC so will expect to fall.   Atrial fibrillation -on diltiazem at home P: Continue heparin gtt   Transition to cardizem PO Continue lopressor, hydralazine scheduled and PRN  HTN P: Continue lopressor, hydralazine   DM2 RLE Diabetic Ulcer P: Discontinue insulin gtt  SSI, moderate scale with Q4 CBG Increase levemir  5 units meal coverage  WOC following for R foot  Cirrhosis on CT - unclear etiology, no ascites on [place]  P: Discontinue lactulose  Best practice:  Diet: NPO Pain/Anxiety/Delirium protocol (if indicated): Off all sedation given mental status VAP protocol (if indicated): Yes DVT prophylaxis: heparin GI prophylaxis: PPI daily Glucose control: CBG q4h, SSI Mobility: BR Code Status: Full code Family Communication: Family called. Planning to come 3/25 for withdrawal of care.  Disposition: ICU   CC Time:  30 minutes   Georgann Housekeeper, AGACNP-BC [place] Pager [phone] or [phone]  04/08/2018 12:18 PM  Attending Note:  68 year old male with extensive PMH who presents to PCCM after respiratory failure from a cardiac arrest and now with severe anoxic injury.  On exam, unresponsive with coarse BS diffusely.  I reviewed CXR myself, ETT is in a good position.  Discussed with PCCM-NP.  Patient has no chance of recovery at this point.  Spoke with patient's family.  Plan is to withdraw and proceed to comfort measures on Wednesday.  DNR for now.  Hold weaning.  Focus more on comfort.  The patient is critically ill with multiple organ systems failure and requires high complexity decision making for assessment and support, frequent evaluation and titration of therapies, application of advanced monitoring technologies and extensive interpretation of multiple databases.   Critical Care Time devoted to patient care services described in this note  is  32  Minutes. This time reflects time of care of this signee Dr Jennet Maduro. This critical care time does not reflect procedure time, or teaching time or supervisory time of PA/NP/Med student/Med Resident etc but could involve care discussion time.  Rush Farmer, M.D. [facility] Pulmonary/Critical Care Medicine. Pager: [phone]. After hours pager: [phone].

## 2018-04-08 NOTE — Progress Notes (Addendum)
Neurology Progress Note   S:// Patient seen and examined this morning. Continues to be on heparin for A. fib. Overnight, A. fib with RVR treated with metoprolol extra dose x1.  Increased metoprolol.  O:// Current vital signs: BP (!) 156/76   Pulse 99   Temp 98.9 F (37.2 C) (Oral)   Resp 18   Ht 6' (1.829 m)   Wt 103.1 kg   SpO2 98%   BMI 30.83 kg/m  Vital signs in last 24 hours: Temp:  [97.7 F (36.5 C)-100.2 F (37.9 C)] 98.9 F (37.2 C) (03/23 0400) Pulse Rate:  [70-121] 99 (03/23 0700) Resp:  [16-30] 18 (03/23 0700) BP: (118-163)/(59-83) 156/76 (03/23 0700) SpO2:  [94 %-99 %] 98 % (03/23 0700) FiO2 (%):  [40 %] 40 % (03/23 0515) Weight:  [103.1 kg] 103.1 kg (03/23 0428) General: Intubated, no sedation HEENT: Normocephalic atraumatic endotracheal tube in place Lungs: Vented, breathing with the ventilator CVs: S1-2 heard, irregularly irregular Abdomen: Soft nondistended nontender Neurological exam Mental status: Opens eyes spontaneously and partially to noxious stimulation. Nonverbal.  Does not follow commands. Cranial nerves: Pupils are both reactive sluggishly with right pupil mildly asymmetric, corneals present bilaterally, difficult to ascertain facial symmetry. Motor exam: Partially opens eyes to noxious stimulation but does not move any of the extremities. Sensory: As above Gait and coordination cannot be tested.  Medications  Current Facility-Administered Medications:  .  0.9 %  sodium chloride infusion, , Intravenous, Continuous, Margaretha Seeds, MD, Stopped at 04/04/18 1323 .  acetaminophen (TYLENOL) solution 650 mg, 650 mg, Oral, Q6H PRN, Kipp Brood, MD, 650 mg at 04/01/18 0801 .  chlorhexidine gluconate (MEDLINE KIT) (PERIDEX) 0.12 % solution 15 mL, 15 mL, Mouth Rinse, BID, Agarwala, Ravi, MD, 15 mL at 04/07/18 1946 .  Chlorhexidine Gluconate Cloth 2 % PADS 6 each, 6 each, Topical, Daily, Kipp Brood, MD, 6 each at 04/07/18 [id number] .  diltiazem  (CARDIZEM) 10 mg/ml oral suspension 60 mg, 60 mg, Per Tube, Q6H, Ollis, Brandi L, NP, 60 mg at 04/08/18 0502 .  docusate (COLACE) 50 MG/5ML liquid 100 mg, 100 mg, Per Tube, BID PRN, Agarwala, Ravi, MD .  feeding supplement (PRO-STAT SUGAR FREE 64) liquid 30 mL, 30 mL, Per Tube, QID, Margaretha Seeds, MD, 30 mL at 04/07/18 2118 .  feeding supplement (VITAL HIGH PROTEIN) liquid 1,000 mL, 1,000 mL, Per Tube, Continuous, Margaretha Seeds, MD, Last Rate: 50 mL/hr at 04/08/18 0400 .  free water 250 mL, 250 mL, Per Tube, Q4H, Ollis, Brandi L, NP, 250 mL at 04/08/18 0638 .  heparin ADULT infusion 100 units/mL (25000 units/250m sodium chloride 0.45%), 1,700 Units/hr, Intravenous, Continuous, BLaren Everts RPH, Last Rate: 17 mL/hr at 04/08/18 0600, 1,700 Units/hr at 04/08/18 0600 .  hydrALAZINE (APRESOLINE) injection 10 mg, 10 mg, Intravenous, Q6H PRN, EMargaretha Seeds MD, 10 mg at 04/04/18 1354 .  hydrALAZINE (APRESOLINE) tablet 10 mg, 10 mg, Oral, Q8H, BSalvadore DomE, NP, 10 mg at 04/08/18 0502 .  insulin aspart (novoLOG) injection 0-15 Units, 0-15 Units, Subcutaneous, Q4H, Ollis, Brandi L, NP, 3 Units at 04/08/18 0402 .  insulin aspart (novoLOG) injection 5 Units, 5 Units, Subcutaneous, Q4H, Ollis, Brandi L, NP, 5 Units at 04/08/18 0402 .  insulin detemir (LEVEMIR) injection 14 Units, 14 Units, Subcutaneous, BID, Ollis, Brandi L, NP, 14 Units at 04/07/18 2117 .  lactulose (CHRONULAC) 10 GM/15ML solution 10 g, 10 g, Per Tube, BID, Ollis, Brandi L, NP, 10 g at 04/07/18 2118 .  MEDLINE mouth rinse, 15 mL, Mouth Rinse, 10 times per day, Agarwala, Einar Grad, MD, 15 mL at 04/08/18 0503 .  metoprolol tartrate (LOPRESSOR) tablet 100 mg, 100 mg, Per Tube, BID, Anders Simmonds, MD .  nystatin cream (MYCOSTATIN), , Topical, BID, Margaretha Seeds, MD .  ondansetron [facility]) injection 4 mg, 4 mg, Intravenous, Q6H PRN, Kipp Brood, MD, 4 mg at 03/29/2018 1821 .  pantoprazole sodium (PROTONIX) 40 mg/20 mL oral  suspension 40 mg, 40 mg, Per Tube, Daily, Margaretha Seeds, MD, 40 mg at 04/07/18 0925 .  polyvinyl alcohol (LIQUIFILM TEARS) 1.4 % ophthalmic solution 1 drop, 1 drop, Both Eyes, PRN, Margaretha Seeds, MD, 1 drop at 04/04/18 1519 .  potassium chloride 20 MEQ/15ML (10%) solution 40 mEq, 40 mEq, Per Tube, BID, Ollis, Brandi L, NP .  sodium chloride flush (NS) 0.9 % injection 10-40 mL, 10-40 mL, Intracatheter, Q12H, Agarwala, Ravi, MD, 10 mL at 04/07/18 2155 .  sodium chloride flush (NS) 0.9 % injection 10-40 mL, 10-40 mL, Intracatheter, PRN, Kipp Brood, MD   Labs CBC    Component Value Date/Time   WBC 17.8 (H) 04/08/2018 0354   RBC 5.46 04/08/2018 0354   HGB 12.0 (L) 04/08/2018 0354   HCT 43.5 04/08/2018 0354   PLT 216 04/08/2018 0354   MCV 79.7 (L) 04/08/2018 0354   MCH 22.0 (L) 04/08/2018 0354   MCHC 27.6 (L) 04/08/2018 0354   RDW 21.0 (H) 04/08/2018 0354   LYMPHSABS 0.7 04/06/2018 0329   MONOABS 1.3 (H) 04/06/2018 0329   EOSABS 0.2 04/06/2018 0329   BASOSABS 0.1 04/06/2018 0329    CMP     Component Value Date/Time   NA 150 (H) 04/08/2018 0354   K 3.7 04/08/2018 0354   CL 113 (H) 04/08/2018 0354   CO2 29 04/08/2018 0354   GLUCOSE 194 (H) 04/08/2018 0354   BUN 57 (H) 04/08/2018 0354   CREATININE 1.42 (H) 04/08/2018 0354   CREATININE 0.91 11/11/2013 1031   CALCIUM 8.8 (L) 04/08/2018 0354   PROT 8.0 03/31/2018 0246   ALBUMIN 3.5 03/31/2018 0246   AST 37 03/31/2018 0246   ALT 23 03/31/2018 0246   ALKPHOS 140 (H) 03/31/2018 0246   BILITOT 1.0 03/31/2018 0246   GFRNONAA 51 (L) 04/08/2018 0354   GFRAA 59 (L) 04/08/2018 0354  Hyponatremic with sodium of 150, glucose 194, BUN 57, creatinine 1.42 which is worse than the prior 2 days.  Magnesium 1.9, phosphorus 2.9. White count elevated at 17.8 increased from 15.1 from yesterday. Hemoglobin 12 decreased from 14 on 04/06/2018 and 12.6 on 04/07/2018  Imaging I have reviewed images in epic and the results pertinent to this  consultation are: MRI done on 04/02/2018 with no acute changes.  Extensive atrophy and white matter disease. Repeat MRI on 04/07/2018 shows T2 signal changes of the caudate and lentiform nuclei far more pronounced compared to the study from 04/02/2018 consistent with hypoxic ischemic encephalopathy.  Stable chronic atrophy and white matter disease.  Assessment: 68 year old man with past medical history of thrombocytopenia, peripheral vascular disease, paranoid type delusional disorder, hypertension, hyperlipidemia, diabetic neuropathy, coronary atherosclerosis, atrial fibrillation, initially presented to outside hospital for abdominal pain shortness of breath and then suffering cardiac arrest on 03/31/2018 with a downtime of about 6 minutes, had a unremarkable MRI nearly 2 days after arrest.  He had intact brainstem reflexes and some eye opening on exam which prompted continued observation for any signs of improvement and also prompted repeat MRI to look for  changes consistent with HIE.  He had overnight EEG done which shows no seizure or status epilepticus. Repeat MRI done last night is revealing of hypoxic ischemic changes in the caudate and lentiform nuclei bilaterally consistent with history of cardiac arrest. He was also being treated with lactulose for consideration of hepatic encephalopathy although his ammonia levels were normal.  Given the recent MRI findings, I think lactulose can be discontinued.  Impression: Hypoxic ischemic encephalopathy following cardiac arrest Prior comorbidities include coronary artery disease, diabetes, peripheral vascular disease, atrial fibrillation  Recommendations: No further imaging recommended at this time. No EEG needed at this time Given that the patient, now about 8 days after his cardiac arrest still has only intact brainstem reflexes and not much of a purposeful examination and the MRI repeated shows hypoxic ischemic encephalopathy, I would expect poor chance  of neurologically meaningful recovery bearing in mind that his MRI not only shows evidence of hypoxic ischemic encephalopathy but also shows extensive atrophy indicating poor baseline. The primary team has had conversations with the family member and the patient is now DNR since yesterday with the understanding that if the examination does not show improvement, they will consider withdrawal of care. With the current exam trend over the past 7 to 8 days as well as new information from the MRI, I would pursue the conversation about withdrawal if deemed appropriate by the primary team. Neurology will be available as needed.  -- Amie Portland, MD [facility] Pager: [phone] If 7pm to 7am, please call on call as listed on AMION.   CRITICAL CARE ATTESTATION Performed by: Amie Portland, MD Total critical care time: 30 minutes Critical care time was exclusive of separately billable procedures and treating other patients and/or supervising APPs/Residents/Students Critical care was necessary to treat or prevent imminent or life-threatening deterioration due to hypoxic ischemic encephalopathy  This patient is critically ill and at significant risk for neurological worsening and/or death and care requires constant monitoring. Critical care was time spent personally by me on the following activities: development of treatment plan with patient and/or surrogate as well as nursing, discussions with consultants, evaluation of patient's response to treatment, examination of patient, obtaining history from patient or surrogate, ordering and performing treatments and interventions, ordering and review of laboratory studies, ordering and review of radiographic studies, pulse oximetry, re-evaluation of patient's condition, participation in multidisciplinary rounds and medical decision making of high complexity in the care of this patient.

## 2018-04-09 LAB — CBC
HCT: 41.1 % (ref 39.0–52.0)
Hemoglobin: 12 g/dL — ABNORMAL LOW (ref 13.0–17.0)
MCH: 23.2 pg — AB (ref 26.0–34.0)
MCHC: 29.2 g/dL — ABNORMAL LOW (ref 30.0–36.0)
MCV: 79.3 fL — AB (ref 80.0–100.0)
Platelets: 222 10*3/uL (ref 150–400)
RBC: 5.18 MIL/uL (ref 4.22–5.81)
RDW: 20.8 % — ABNORMAL HIGH (ref 11.5–15.5)
WBC: 22.3 10*3/uL — ABNORMAL HIGH (ref 4.0–10.5)
nRBC: 0 % (ref 0.0–0.2)

## 2018-04-09 LAB — BASIC METABOLIC PANEL
Anion gap: 8 (ref 5–15)
BUN: 70 mg/dL — ABNORMAL HIGH (ref 8–23)
CO2: 26 mmol/L (ref 22–32)
Calcium: 8.3 mg/dL — ABNORMAL LOW (ref 8.9–10.3)
Chloride: 115 mmol/L — ABNORMAL HIGH (ref 98–111)
Creatinine, Ser: 1.55 mg/dL — ABNORMAL HIGH (ref 0.61–1.24)
GFR calc Af Amer: 53 mL/min — ABNORMAL LOW (ref >60)
GFR calc non Af Amer: 46 mL/min — ABNORMAL LOW (ref >60)
GLUCOSE: 225 mg/dL — AB (ref 70–99)
Potassium: 4.2 mmol/L (ref 3.5–5.1)
Sodium: 149 mmol/L — ABNORMAL HIGH (ref 135–145)

## 2018-04-09 LAB — GLUCOSE, CAPILLARY
GLUCOSE-CAPILLARY: 197 mg/dL — AB (ref 70–99)
Glucose-Capillary: 153 mg/dL — ABNORMAL HIGH (ref 70–99)
Glucose-Capillary: 155 mg/dL — ABNORMAL HIGH (ref 70–99)
Glucose-Capillary: 164 mg/dL — ABNORMAL HIGH (ref 70–99)
Glucose-Capillary: 171 mg/dL — ABNORMAL HIGH (ref 70–99)
Glucose-Capillary: 202 mg/dL — ABNORMAL HIGH (ref 70–99)
Glucose-Capillary: 204 mg/dL — ABNORMAL HIGH (ref 70–99)
Glucose-Capillary: 224 mg/dL — ABNORMAL HIGH (ref 70–99)
Glucose-Capillary: 237 mg/dL — ABNORMAL HIGH (ref 70–99)

## 2018-04-09 LAB — MAGNESIUM: Magnesium: 1.9 mg/dL (ref 1.7–2.4)

## 2018-04-09 LAB — PHOSPHORUS: Phosphorus: 2.5 mg/dL (ref 2.5–4.6)

## 2018-04-09 LAB — HEPARIN LEVEL (UNFRACTIONATED): Heparin Unfractionated: 0.31 IU/mL (ref 0.30–0.70)

## 2018-04-09 MED ORDER — INSULIN DETEMIR 100 UNIT/ML ~~LOC~~ SOLN
22.0000 [IU] | Freq: Two times a day (BID) | SUBCUTANEOUS | Status: DC
  Administered 2018-04-09: 22 [IU] via SUBCUTANEOUS
  Filled 2018-04-09 (×2): qty 0.22

## 2018-04-09 MED ORDER — MORPHINE 100MG IN NS 100ML (1MG/ML) PREMIX INFUSION
0.0000 mg/h | INTRAVENOUS | Status: DC
  Administered 2018-04-09: 2 mg/h via INTRAVENOUS
  Administered 2018-04-10: 5 mg/h via INTRAVENOUS
  Filled 2018-04-09 (×2): qty 100

## 2018-04-09 NOTE — Progress Notes (Signed)
NAME:  Samuel Mccoy, MRN:  [id number], DOB:  1950-02-22, LOS: 9 ADMISSION DATE:  03/25/2018, CONSULTATION DATE:  03/20/2018 REFERRING MD:  Colin Rhein [facility] ED CHIEF COMPLAINT:  Abdominal pain  Brief History   68 year old male initially presented with weakness, dyspnea and abdominal pain to [facility]. Cardiac arrest on 3/15 requiring intubation.  Found to be metapneumovirus positive.   Past Medical History  Atrial fibrillation, chronic anticoagulation, CAD, DM2, HTN, HLD, schizophrenia  Significant Hospital Events   3/14 Transferred from [place] 3/15 Intubated 3/21 Remains on heparin gtt, diltiazem, weaning PSV 12/5 3/22 Continuous EEG in process   Consults:  PCCM  Procedures:  ETT 3/15 >>  Significant Diagnostic Tests:  CT A/P >> Consistent with cirrhosis with a small amount of ascites. Bilateral pleural effusions. Generalized edema.  No evidence of bowel obstruction. TTE 3/14 >> EF 60-65%. No WMA. Mild concentric LVH CT head 3/16 >> no acute process. EEG 3/17 >> no seizure activity.  Generalized encephalopathy. MRI 3/17 >> no acute abnormality, chronic small vessel disease MRI 3/22 > consistent with hypoxic ischemic encephalopathy.  Micro Data:  RVP 3/16 >> metapneumovirus positive Sputum 3/16 >> rare candida   Antimicrobials:  Ceftriaxone 3/14 >> 3/18 Azithro 3/14 >> 3/18  Interim history/subjective:  No events overnight, no new complaints  Objective   Blood pressure (!) 144/81, pulse (!) 105, temperature 99.2 F (37.3 C), temperature source Oral, resp. rate (!) 22, height 6' (1.829 m), weight 105.1 kg, SpO2 97 %.    Vent Mode: PRVC FiO2 (%):  [40 %] 40 % Set Rate:  [16 bmp] 16 bmp Vt Set:  [620 mL] 620 mL PEEP:  [5 cmH20] 5 cmH20 Plateau Pressure:  [15 cmH20-22 cmH20] 15 cmH20   Intake/Output Summary (Last 24 hours) at 04/09/2018 1013 Last data filed at 04/09/2018 0900 Gross per 24 hour  Intake 1847.62 ml  Output 3050 ml  Net -1202.38 ml   Filed  Weights   04/08/18 0000 04/08/18 0428 04/09/18 0407  Weight: 103.1 kg 103.1 kg 105.1 kg    Physical Exam: General: Acutely ill appearing male, NAD HEENT: [place]/AT, PERRL, EOM-I and MMM Neuro: Unresponsive, not following commands CV: RRR, Nl S1/S2 and -M/R/G PULM: CTA bilaterally GI: Soft, NT, ND and +BS Extremities: R foot dressing in place CDI.   Assessment & Plan:   S/p Cardiac Arrest - likely secondary to hypoxemia P: Continue ICU monitoring Tele monitoring  Acute Hypoxemic / Hypercarbic Respiratory Failure secondary to CAP, Metapneumovirus, cardiac arrest, ?flash pulmonary edema. TTE normal. Evidence of volume overload on BNP and exam. Discussed case with ID to consider COVID-19 testing however patient has alternative diagnosis and does not have travel exposure or positive patient contact to be considered for testing.  P: Continue full vent support Hold off weaning for now Holding diuresis.   Acute Encephalopathy  - Head CT, MR Brain and EEG negative initially, but now MRI on 3/22 consistent with hypoxemic injury. Neurology feels there is a very poor prognosis associated with his condition. -Cirrhosis on CT - Unclear etiology. OSH ammonia 9.0. Hepatic encephalopathy not felt to be playing a significant role. He is s/p several days of lactulose without clinical improvement.  P: Appreciate Neurology input Family has changed code status to DNR as a result of recent findings. They will ultimately elect to withdrawal care, but are unable to arrange visitation until 3/25 due to recent illness. They would like [place] to continue supportive care until that time. No escalation.  Plan comfort measures in AM  Hx of schizophrenia.  - reportedly agitated on admit; however, unresponsive since 3/17 P: Supportive care  AKI  Hypokalemia Hypernatremia P: D/C further blood draws  Atrial fibrillation -on diltiazem at home P: D/C heparin in AM No discontinuation of any further medications  at this point  HTN P: Continue lopressor, hydralazine   DM2 RLE Diabetic Ulcer P: Discontinue insulin gtt  SSI, moderate scale with Q4 CBG Increase levemir  WOC following for R foot  Cirrhosis on CT - unclear etiology, no ascites on [place]  P: Discontinue lactulose  Plan to withdraw in AM  Best practice:  Diet: NPO Pain/Anxiety/Delirium protocol (if indicated): Off all sedation given mental status VAP protocol (if indicated): Yes DVT prophylaxis: heparin GI prophylaxis: PPI daily Glucose control: CBG q4h, SSI Mobility: BR Code Status: Full code Family Communication: Family called. Planning to come 3/25 for withdrawal of care.  Disposition: ICU  The patient is critically ill with multiple organ systems failure and requires high complexity decision making for assessment and support, frequent evaluation and titration of therapies, application of advanced monitoring technologies and extensive interpretation of multiple databases.   Critical Care Time devoted to patient care services described in this note is  32  Minutes. This time reflects time of care of this signee Dr Jennet Maduro. This critical care time does not reflect procedure time, or teaching time or supervisory time of PA/NP/Med student/Med Resident etc but could involve care discussion time.  Rush Farmer, M.D. [facility] Pulmonary/Critical Care Medicine. Pager: [phone]. After hours pager: [phone].

## 2018-04-09 NOTE — Progress Notes (Signed)
ANTICOAGULATION CONSULT NOTE - Follow Up Consult  Pharmacy Consult for heparin Indication: atrial fibrillation  Labs: Recent Labs    04/07/18 0415 04/08/18 0354 04/08/18 1100 04/09/18 0351  HGB 12.6* 12.0*  --  12.0*  HCT 43.4 43.5  --  41.1  PLT 190 216  --  222  HEPARINUNFRC 0.33 0.17* 0.32 0.31  CREATININE 1.40* 1.42*  --  1.55*    Assessment: 68yo male with history of atrial fibrillation and possibly taking rivaroxaban PTA. Pharmacy consulted to start heparin drip.   Patient therapeutic today with heparin level 0.31, CBCs stable. No problems with heparin drip or s/sx of bleeding documented.   Goal of Therapy:  Heparin level 0.3-0.7 units/ml   Plan:  Will slightly increase heparin to 1800 units/hr to keep patient levels therapeutic  Monitor daily heparin levels, CBCs, and s/sx of bleeding.     Gwenlyn Found, Sherian Rein D PGY1 Pharmacy Resident  Phone [phone] 04/09/2018   7:09 AM

## 2018-04-10 LAB — CBC
HCT: 39.3 % (ref 39.0–52.0)
Hemoglobin: 10.8 g/dL — ABNORMAL LOW (ref 13.0–17.0)
MCH: 22.3 pg — ABNORMAL LOW (ref 26.0–34.0)
MCHC: 27.5 g/dL — ABNORMAL LOW (ref 30.0–36.0)
MCV: 81 fL (ref 80.0–100.0)
NRBC: 0 % (ref 0.0–0.2)
Platelets: 220 10*3/uL (ref 150–400)
RBC: 4.85 MIL/uL (ref 4.22–5.81)
RDW: 20.8 % — ABNORMAL HIGH (ref 11.5–15.5)
WBC: 22.3 10*3/uL — ABNORMAL HIGH (ref 4.0–10.5)

## 2018-04-10 LAB — GLUCOSE, CAPILLARY
Glucose-Capillary: 147 mg/dL — ABNORMAL HIGH (ref 70–99)
Glucose-Capillary: 127 mg/dL — ABNORMAL HIGH (ref 70–99)
Glucose-Capillary: 131 mg/dL — ABNORMAL HIGH (ref 70–99)

## 2018-04-10 LAB — PHOSPHORUS: Phosphorus: 3.7 mg/dL (ref 2.5–4.6)

## 2018-04-10 LAB — MAGNESIUM: MAGNESIUM: 1.9 mg/dL (ref 1.7–2.4)

## 2018-04-10 LAB — HEPARIN LEVEL (UNFRACTIONATED): Heparin Unfractionated: 0.36 IU/mL (ref 0.30–0.70)

## 2018-04-10 MED ORDER — GLYCOPYRROLATE 0.2 MG/ML IJ SOLN: 0.2000 mg | INTRAMUSCULAR | Status: DC | PRN

## 2018-04-10 MED ORDER — GLYCOPYRROLATE 1 MG PO TABS
1.0000 mg | ORAL_TABLET | ORAL | Status: DC | PRN
  Filled 2018-04-10: qty 1

## 2018-04-10 MED ORDER — DIPHENHYDRAMINE HCL 50 MG/ML IJ SOLN: 25.0000 mg | INTRAMUSCULAR | Status: DC | PRN

## 2018-04-11 ENCOUNTER — Encounter (INDEPENDENT_AMBULATORY_CARE_PROVIDER_SITE_OTHER): Payer: Medicare Other | Admitting: Ophthalmology

## 2018-04-15 ENCOUNTER — Telehealth: Payer: Self-pay | Admitting: Pulmonary Disease

## 2018-04-15 NOTE — Telephone Encounter (Incomplete)
04/15/18 received D/C from [employer] for Dr.Yacoub will take to him at.76M. PWR

## 2018-04-17 ENCOUNTER — Telehealth: Payer: Self-pay

## 2018-04-17 NOTE — Progress Notes (Addendum)
NAME:  Samuel Mccoy, MRN:  [id number], DOB:  1950/05/08, LOS: 10 ADMISSION DATE:  04/03/2018, CONSULTATION DATE:  03/21/2018 REFERRING MD:  Colin Rhein [facility] ED CHIEF COMPLAINT:  Abdominal pain  Brief History   68 year old male initially presented with weakness, dyspnea and abdominal pain to [facility]. Cardiac arrest on 3/15 requiring intubation.  Found to be metapneumovirus positive. 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  Past Medical History  Atrial fibrillation, chronic anticoagulation, CAD, DM2, HTN, HLD, schizophrenia  Significant Hospital Events   3/14 Transferred from [place] 3/15 Intubated 3/21 Remains on heparin gtt, diltiazem, weaning PSV 12/5 3/22 Continuous EEG in process; MRI c/w anoxic injury 3/24 no improvements   Consults:  PCCM Neurology  Procedures:  ETT 3/15 >>  Significant Diagnostic Tests:  CT A/P >> Consistent with cirrhosis with a small amount of ascites. Bilateral pleural effusions. Generalized edema.  No evidence of bowel obstruction. TTE 3/14 >> EF 60-65%. No WMA. Mild concentric LVH CT head 3/16 >> no acute process. EEG 3/17 >> no seizure activity.  Generalized encephalopathy. MRI 3/17 >> no acute abnormality, chronic small vessel disease MRI 3/22 > consistent with hypoxic ischemic encephalopathy.  Micro Data:  RVP 3/16 >> metapneumovirus positive Sputum 3/16 >> rare candida   Antimicrobials:  Ceftriaxone 3/14 >> 3/18 Azithro 3/14 >> 3/18  Interim history/subjective:  No change in mental status, intermittently bites with mouth care but no other response. Worsening hypotension Remains on morphine 5 mg/hr Family at bedside now for transition to comfort care  Objective   Blood pressure (!) 69/55, pulse (!) 107, temperature 99.3 F (37.4 C), temperature source Oral, resp. rate 16, height 6' (1.829 m), weight 105.1 kg, SpO2 97 %.    Vent Mode: PRVC FiO2 (%):  [40 %] 40 % Set Rate:  [16 bmp] 16 bmp Vt Set:  [620 mL] 620 mL PEEP:   [5 cmH20] 5 cmH20 Plateau Pressure:  [17 cmH20-20 cmH20] 17 cmH20   Intake/Output Summary (Last 24 hours) at 2018/05/04 1008 Last data filed at 2018/05/04 0800 Gross per 24 hour  Intake 1864.09 ml  Output 1700 ml  Net 164.09 ml   Filed Weights   04/08/18 0428 04/09/18 0407 May 04, 2018 0500  Weight: 103.1 kg 105.1 kg 105.1 kg    Physical Exam: General: Critically ill adult male in NAD on MV  HEENT: pupils/ sluggish, ETT/ OGT Neuro: unresponsive CV:  IRIR, no murmur PULM: even/non-labored, lungs bilaterally coarse- on PSV- no effort GI: soft, +bs  Extremities: warm/dry, generalized edema, dressings to RLE Skin: no rashes   Assessment & Plan:   Cardiac Arrest - likely secondary to hypoxemia Anoxic Encephalopathy   Acute Hypoxemic / Hypercarbic Respiratory Failure secondary to CAP, Metapneumovirus, cardiac arrest, ?flash pulmonary edema.  Hx of schizophrenia AKI  Hypokalemia Hypernatremia Atrial fibrillation HTN DM2 RLE Diabetic Ulcer Cirrhosis on CT  P: Patient's brother and sister present at bedside. Will proceed with transition to comfort care and extubation.  Continue morphine gtt for comfort.  Robinul prn. Ongoing emotional support provided to family.   Best practice:  Mobility: BR Code Status: DNR Family Communication:  Brother and sister at bedside for withdrawal of care.  Disposition: ICU   CCT 35 min  Kennieth Rad, MSN, AGACNP-BC [facility] Pgr: [phone] or if no answer [phone] 04-May-2018, 10:27 AM  Attending Note:  68 year old male s/p cardiac arrest with severe anoxic brain injury who presents to PCCM with respiratory failure.  On exam, patient  is completely unresponsive, not following any commands.  I reviewed CXR myself, ETT is in a good position.  Spoke with family, will plan for terminal extubation after a long discussion bedside today.  Transition to morphine and to comfort today.  The patient is critically ill with  multiple organ systems failure and requires high complexity decision making for assessment and support, frequent evaluation and titration of therapies, application of advanced monitoring technologies and extensive interpretation of multiple databases.   Critical Care Time devoted to patient care services described in this note is  31  Minutes. This time reflects time of care of this signee Dr Jennet Maduro. This critical care time does not reflect procedure time, or teaching time or supervisory time of PA/NP/Med student/Med Resident etc but could involve care discussion time.  Rush Farmer, M.D. [facility] Pulmonary/Critical Care Medicine. Pager: [phone]. After hours pager: [phone].

## 2018-04-17 NOTE — Progress Notes (Signed)
ANTICOAGULATION CONSULT NOTE - Follow Up Consult  Pharmacy Consult for heparin Indication: atrial fibrillation  Labs: Recent Labs    04/08/18 0354 04/08/18 1100 04/09/18 0351 Apr 27, 2018 0457  HGB 12.0*  --  12.0* 10.8*  HCT 43.5  --  41.1 39.3  PLT 216  --  222 220  HEPARINUNFRC 0.17* 0.32 0.31 0.36  CREATININE 1.42*  --  1.55*  --     Assessment: 68yo male with history of atrial fibrillation and possibly taking rivaroxaban PTA. Pharmacy consulted to start heparin drip.   Patient therapeutic today with heparin level 0.36, CBCs stable. No problems with heparin drip or s/sx of bleeding documented.   Goal of Therapy:  Heparin level 0.3-0.7 units/ml   Plan:  Will continue heparin at 1800 units/hr for now  Will follow-up with plans of care and plans for heparin therapy continuation  Monitor daily heparin levels, CBCs, and s/sx of bleeding.     Gwenlyn Found, Sherian Rein D PGY1 Pharmacy Resident  Phone [phone] Apr 27, 2018   6:48 AM

## 2018-04-17 NOTE — Progress Notes (Signed)
Entered patient room to find patient apneic and asystolic. Heart sounds absent; auscultated by 2 RNs - Samaya Boardley Aaron Edelman and Sharyn Dross. Patient's sister Sharee Pimple at bedside. Dr. Nelda Marseille notified.   Joellen Jersey, RN

## 2018-04-17 NOTE — Progress Notes (Signed)
Nutrition Brief Note  Chart reviewed. Pt now transitioning to comfort care.  No further nutrition interventions warranted at this time.  Please re-consult as needed.   Decklyn Hornik RD, LDN Clinical Nutrition Pager # - [phone]    

## 2018-04-17 NOTE — Progress Notes (Signed)
RT NOTE: RT extubated patient to comfort per MD order and family wishes. RN at bedside.  

## 2018-04-17 NOTE — Plan of Care (Signed)
Patient transitioned to comfort care. Family at bedside.

## 2018-04-17 NOTE — Telephone Encounter (Signed)
On 04/17/2018 I received dc back from Doctor Nelda Marseille. I got dc ready and called the funeral home to let them know dc was mailed to [employer] per the funeral home request.

## 2018-04-17 NOTE — Progress Notes (Signed)
Wasted 75 mls IV morphine with Warden Fillers, RN.  Joellen Jersey, RN

## 2018-04-17 DEATH — deceased

## 2018-05-17 NOTE — Death Summary Note (Signed)
DEATH SUMMARY   Patient Details  Name: Samuel Mccoy MRN: [id number] DOB: Jun 15, 1950  Admission/Discharge Information   Admit Date:  2018-04-07  Date of Death: Date of Death: 04-18-18  Time of Death: Time of Death: Apr 18, 1110  Length of Stay: 04/03/22  Referring Physician: Patient, No Pcp Per   Reason(s) for Hospitalization  Abdominal pain  Diagnoses  Preliminary cause of death:  Anoxic brain injury Secondary Diagnoses (including complications and co-morbidities):  Active Problems:   Ascites Acute respiratory failure with hypoxemia Anoxic brain injury A-fib with RVR  Brief Hospital Course (including significant findings, care, treatment, and services provided and events leading to death)  68 year old male initially presented with weakness, dyspnea and abdominal pain to [facility]. Cardiac arrest on 3/15 requiring intubation.  Found to be metapneumovirus positive. 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  Patient was transferred to [facility] at 2022-04-07, intubated 3/15.  Remained encephalopathic and MRI showed anoxic injury.    Had a family meeting, family elected to proceed with terminal extubation.  Morphine was started, patient was extubated to expire shortly thereafter with the family bedside.    Pertinent Labs and Studies  Significant Diagnostic Studies Dg Chest 1 View  Result Date: 03/31/2018 CLINICAL DATA:  ETT.  Pneumonia. EXAM: CHEST  1 VIEW COMPARISON:  2018/04/07 FINDINGS: The ETT terminates in mid trachea, in good position. The NG tube distal tip is not well seen due to poor penetration the but appears to terminate below the diaphragm. Right-sided pneumonia persists in the upper lobe particularly. No other changes. IMPRESSION: 1. Support apparatus as above.  The ETT is in good position. 2. Right-sided pneumonia again identified.  No other acute changes. Electronically Signed   By: Dorise Bullion III M.D   On: 03/31/2018 15:22   Ct Head Wo Contrast  Result Date:  04/01/2018 CLINICAL DATA:  Post arrest, anoxia EXAM: CT HEAD WITHOUT CONTRAST TECHNIQUE: Contiguous axial images were obtained from the base of the skull through the vertex without intravenous contrast. COMPARISON:  10/19/2017 FINDINGS: Brain: No evidence of acute infarction, hemorrhage, hydrocephalus, extra-axial collection or mass lesion/mass effect. Unchanged mild periventricular white matter hypodensity. Unchanged small probable subarachnoid cyst about the left hemisphere. Vascular: No hyperdense vessel or unexpected calcification. Skull: Normal. Negative for fracture or focal lesion. Sinuses/Orbits: No acute finding. Other: Partially imaged endotracheal and orogastric tubes. IMPRESSION: No acute intracranial pathology. Unchanged mild small-vessel white matter disease. MRI is the test of choice for evaluation of ischemic diffusion restriction in the setting of suspected anoxic brain injury. Electronically Signed   By: Eddie Candle M.D.   On: 04/01/2018 15:33   Mr Brain Wo Contrast  Result Date: 04/07/2018 CLINICAL DATA:  Anoxic brain damage. Cardiac arrest 3/15 with down time of approximately 6 minutes. EXAM: MRI HEAD WITHOUT CONTRAST TECHNIQUE: Multiplanar, multiecho pulse sequences of the brain and surrounding structures were obtained without intravenous contrast. COMPARISON:  MRI brain 04/02/2018 FINDINGS: Brain: T2 signal changes in the caudate nuclei and anterior lentiform nuclei are more pronounced than on the prior exam. These findings are consistent with hypoxic ischemic encephalopathy. No significant restricted diffusion is associated. An arachnoid cyst over the left frontal lobe is again seen. Mild periventricular and subcortical white matter disease is otherwise stable. Vascular: Flow is present in the major intracranial arteries. Skull and upper cervical spine: Insert normal craniocervical Sinuses/Orbits: Progressive sinus disease is present. A fluid level is present in the right maxillary sinus.  There is some fluid in the nasopharynx.  Bilateral mastoid effusions are again noted. This is likely secondary to intubated status. The globes and orbits are within normal limits. IMPRESSION: 1. T2 signal changes of in the caudate and lentiform nuclei or more pronounced bilaterally on today's study, consistent with hypoxic ischemic encephalopathy. This correlates with patient's episode cardiac arrest. 2. Chronic atrophy and white matter disease is otherwise stable. 3. Progressive sinus disease. Electronically Signed   By: San Morelle M.D.   On: 04/07/2018 18:00   Mr Brain Wo Contrast  Result Date: 04/02/2018 CLINICAL DATA:  Altered mental status EXAM: MRI HEAD WITHOUT CONTRAST TECHNIQUE: Multiplanar, multiecho pulse sequences of the brain and surrounding structures were obtained without intravenous contrast. COMPARISON:  04/01/2018 head CT FINDINGS: BRAIN: There is no acute infarct, acute hemorrhage or extra-axial collection. The midline structures are normal. No midline shift or other mass effect. Multifocal white matter hyperintensity, most commonly due to chronic ischemic microangiopathy. Generalized atrophy without lobar predilection. Susceptibility-sensitive sequences show no chronic microhemorrhage or superficial siderosis. No mass lesion. Upper left convexity arachnoid cyst. VASCULAR: The major intracranial arterial and venous sinus flow voids are normal. SKULL AND UPPER CERVICAL SPINE: Calvarial bone marrow signal is normal. There is no skull base mass. Visualized upper cervical spine and soft tissues are normal. SINUSES/ORBITS: Bilateral mastoid fluid. Mild paranasal sinus mucosal thickening, predominantly ethmoid. The orbits are normal. IMPRESSION: 1. No acute abnormality. 2. Chronic small vessel disease and general volume loss. Electronically Signed   By: Ulyses Jarred M.D.   On: 04/02/2018 23:19   US Abdomen Limited  Result Date: 04/09/2018 CLINICAL DATA:  Evaluate for ascites and perform  ultrasound-guided paracentesis as indicated. EXAM: LIMITED ABDOMEN ULTRASOUND FOR ASCITES TECHNIQUE: Limited ultrasound survey for ascites was performed in all four abdominal quadrants. COMPARISON:  CT abdomen pelvis-03/29/2018 FINDINGS: There is a trace amount intra-abdominal ascites, too small to allow for safe ultrasound-guided paracentesis. No paracentesis attempted. IMPRESSION: Trace amount of intra-abdominal ascites, too small to allow for safe ultrasound-guided paracentesis. No paracentesis attempted Electronically Signed   By: Sandi Mariscal M.D.   On: 03/24/2018 15:33   Dg Chest Port 1 View  Result Date: 04/07/2018 CLINICAL DATA:  Endotracheal tube. EXAM: PORTABLE CHEST 1 VIEW COMPARISON:  Chest x-rays dated 04/06/2018 and 04/05/2018. FINDINGS: Endotracheal tube in place with tip positioned approximately 7 cm above the level of the carina. Enteric tube passes below the diaphragm. RIGHT-sided PICC line appears well positioned with tip at the expected level of the cavoatrial junction. Heart size and mediastinal contours are stable. Increased opacity at the RIGHT lung base, likely a combination of atelectasis and pleural effusion, likely accentuated by the semi-erect positioning. Persistent central pulmonary vascular congestion and bilateral interstitial edema, RIGHT slightly greater than LEFT, not significantly changed given the difference in positioning. IMPRESSION: 1. Endotracheal tube in place with tip slightly above the level of the clavicles and approximately 7 cm above the level of the carina. Consider advancing 2-4 cm for more optimal radiographic positioning. 2. Increased opacity at the RIGHT lung base, likely a combination of atelectasis and small pleural effusion, likely accentuated by the semi-erect patient positioning. 3. Persistent central pulmonary vascular congestion and bilateral interstitial edema indicating CHF/volume overload. Electronically Signed   By: Franki Cabot M.D.   On: 04/07/2018  15:41   Dg Chest Port 1 View  Result Date: 04/06/2018 CLINICAL DATA:  Respiratory failure EXAM: PORTABLE CHEST 1 VIEW COMPARISON:  04/05/2018 FINDINGS: Endotracheal tube in good position. NG in the stomach. Right arm PICC tip at the  cavoatrial junction. Bibasilar airspace disease with mild interval improvement. Small right effusion unchanged. IMPRESSION: Support lines remain in good position. Bibasilar airspace disease with mild interval improvement. Electronically Signed   By: Franchot Gallo M.D.   On: 04/06/2018 08:38   Dg Chest Port 1 View  Result Date: 04/05/2018 CLINICAL DATA:  Respiratory failure EXAM: PORTABLE CHEST 1 VIEW COMPARISON:  Two days ago FINDINGS: Endotracheal tube tip at the clavicular heads. Right PICC with tip at the SVC. The orogastric tube at least reaches the stomach. Airspace opacity at the right more than left base. There is hazy density at the right base where there was pleural fluid on abdominal CT 03/29/2018. No pneumothorax. Normal heart size. Coronary stent is present IMPRESSION: Stable hardware positioning and airspace disease with probable right pleural effusion. Electronically Signed   By: Monte Fantasia M.D.   On: 04/05/2018 06:32   Dg Chest Port 1 View  Result Date: 04/03/2018 CLINICAL DATA:  Hypoxia EXAM: PORTABLE CHEST 1 VIEW COMPARISON:  April 01, 2018 FINDINGS: Endotracheal tube tip is 4.3 cm above the carina. Central catheter tip is at the superior vena cava-right atrium junction. Nasogastric tube tip and side port are below the diaphragm. No pneumothorax. There is a right pleural effusion. There has been interval clearing of consolidation from the right upper lobe with mild atelectatic change remaining in this area. There is patchy airspace opacity in both lower lung zones. Heart is upper normal in size with pulmonary vascularity normal. No adenopathy. No bone lesions. IMPRESSION: Tube and catheter positions as described without pneumothorax. Interval clearing  of consolidation from the right upper lobe with mild atelectasis remaining in this area. Right pleural effusion with bibasilar opacity, largely due to atelectasis. A degree of superimposed edema or pneumonia in the bases can not be excluded. Stable cardiac silhouette. Electronically Signed   By: Lowella Grip III M.D.   On: 04/03/2018 07:37   Dg Chest Port 1 View  Result Date: 04/01/2018 CLINICAL DATA:  Follow-up endotracheal tube EXAM: PORTABLE CHEST 1 VIEW COMPARISON:  03/31/2018 FINDINGS: Cardiac shadow is stable. Increased vascular congestion is noted when compared with the previous day. Persistent right upper lobe infiltrate is seen. Endotracheal tube, gastric catheter and right-sided PICC line are noted in satisfactory position. No acute bony abnormality is noted. IMPRESSION: Persistent right upper lobe infiltrate is noted. Increasing central vascular congestion and edema. Electronically Signed   By: Inez Catalina M.D.   On: 04/01/2018 07:29   Dg Chest Port 1 View  Result Date: 03/31/2018 CLINICAL DATA:  Evaluate central line EXAM: PORTABLE CHEST 1 VIEW COMPARISON:  March 31, 2018 FINDINGS: The new right PICC line terminates in the central SVC. The ETT is in good position. The NG tube terminates below today's film. Worsening right suprahilar infiltrate. IMPRESSION: 1. Support apparatus as above. The new right PICC line is in good position. 2. Worsening right suprahilar infiltrate. Electronically Signed   By: Dorise Bullion III M.D   On: 03/31/2018 17:37   Dg Chest Port 1 View  Result Date: 04/14/2018 CLINICAL DATA:  Increased shortness of breath and wheezing EXAM: PORTABLE CHEST 1 VIEW COMPARISON:  Chest radiograph 03/29/2018 FINDINGS: Monitoring leads overlie the patient. Stable cardiac and mediastinal contours. Interval development of extensive consolidation throughout the right lung. Moderate right pleural effusion. IMPRESSION: Interval development of extensive consolidation throughout the  right lung which may represent pneumonia in the appropriate clinical setting. Moderate right pleural effusion. Electronically Signed   By: Polly Cobia.D.  On: 04/04/2018 16:31   Dg Abd Portable 1v  Result Date: 04/05/2018 CLINICAL DATA:  OG tube placed EXAM: PORTABLE ABDOMEN - 1 VIEW COMPARISON:  04/04/2018 FINDINGS: NG tube with tip in the gastric antrum/pylorus. No change from prior. No evidence of bowel obstruction. IMPRESSION: NG tube with tip in distal stomach. Electronically Signed   By: Suzy Bouchard M.D.   On: 04/05/2018 12:21   Dg Abd Portable 1v  Result Date: 04/04/2018 CLINICAL DATA:  Status post OG tube placement. EXAM: PORTABLE ABDOMEN - 1 VIEW COMPARISON:  None. FINDINGS: OG tube is in place with the tip in the distal stomach, likely at the pylorus. IMPRESSION: As above. Electronically Signed   By: Inge Rise M.D.   On: 04/04/2018 14:17   [place] Ekg Site Rite  Result Date: 03/31/2018 If Site Rite image not attached, placement could not be confirmed due to current cardiac rhythm.   Microbiology No results found for this or any previous visit (from the past 240 hour(s)).  Lab Basic Metabolic Panel: No results for input(s): NA, K, CL, CO2, GLUCOSE, BUN, CREATININE, CALCIUM, MG, PHOS in the last 168 hours. Liver Function Tests: No results for input(s): AST, ALT, ALKPHOS, BILITOT, PROT, ALBUMIN in the last 168 hours. No results for input(s): LIPASE, AMYLASE in the last 168 hours. No results for input(s): AMMONIA in the last 168 hours. CBC: No results for input(s): WBC, NEUTROABS, HGB, HCT, MCV, PLT in the last 168 hours. Cardiac Enzymes: No results for input(s): CKTOTAL, CKMB, CKMBINDEX, TROPONINI in the last 168 hours. Sepsis Labs: No results for input(s): PROCALCITON, WBC, LATICACIDVEN in the last 168 hours.  Procedures/Operations     YACOUB,WESAM 04/17/2018, 5:15 PM

## 2020-04-29 IMAGING — DX PORTABLE CHEST - 1 VIEW
1 series · 1 of 1 positions shown · non-contrast
Comparison: March 31, 2018

CLINICAL DATA: Evaluate central line

EXAM:
PORTABLE CHEST 1 VIEW

[chest]
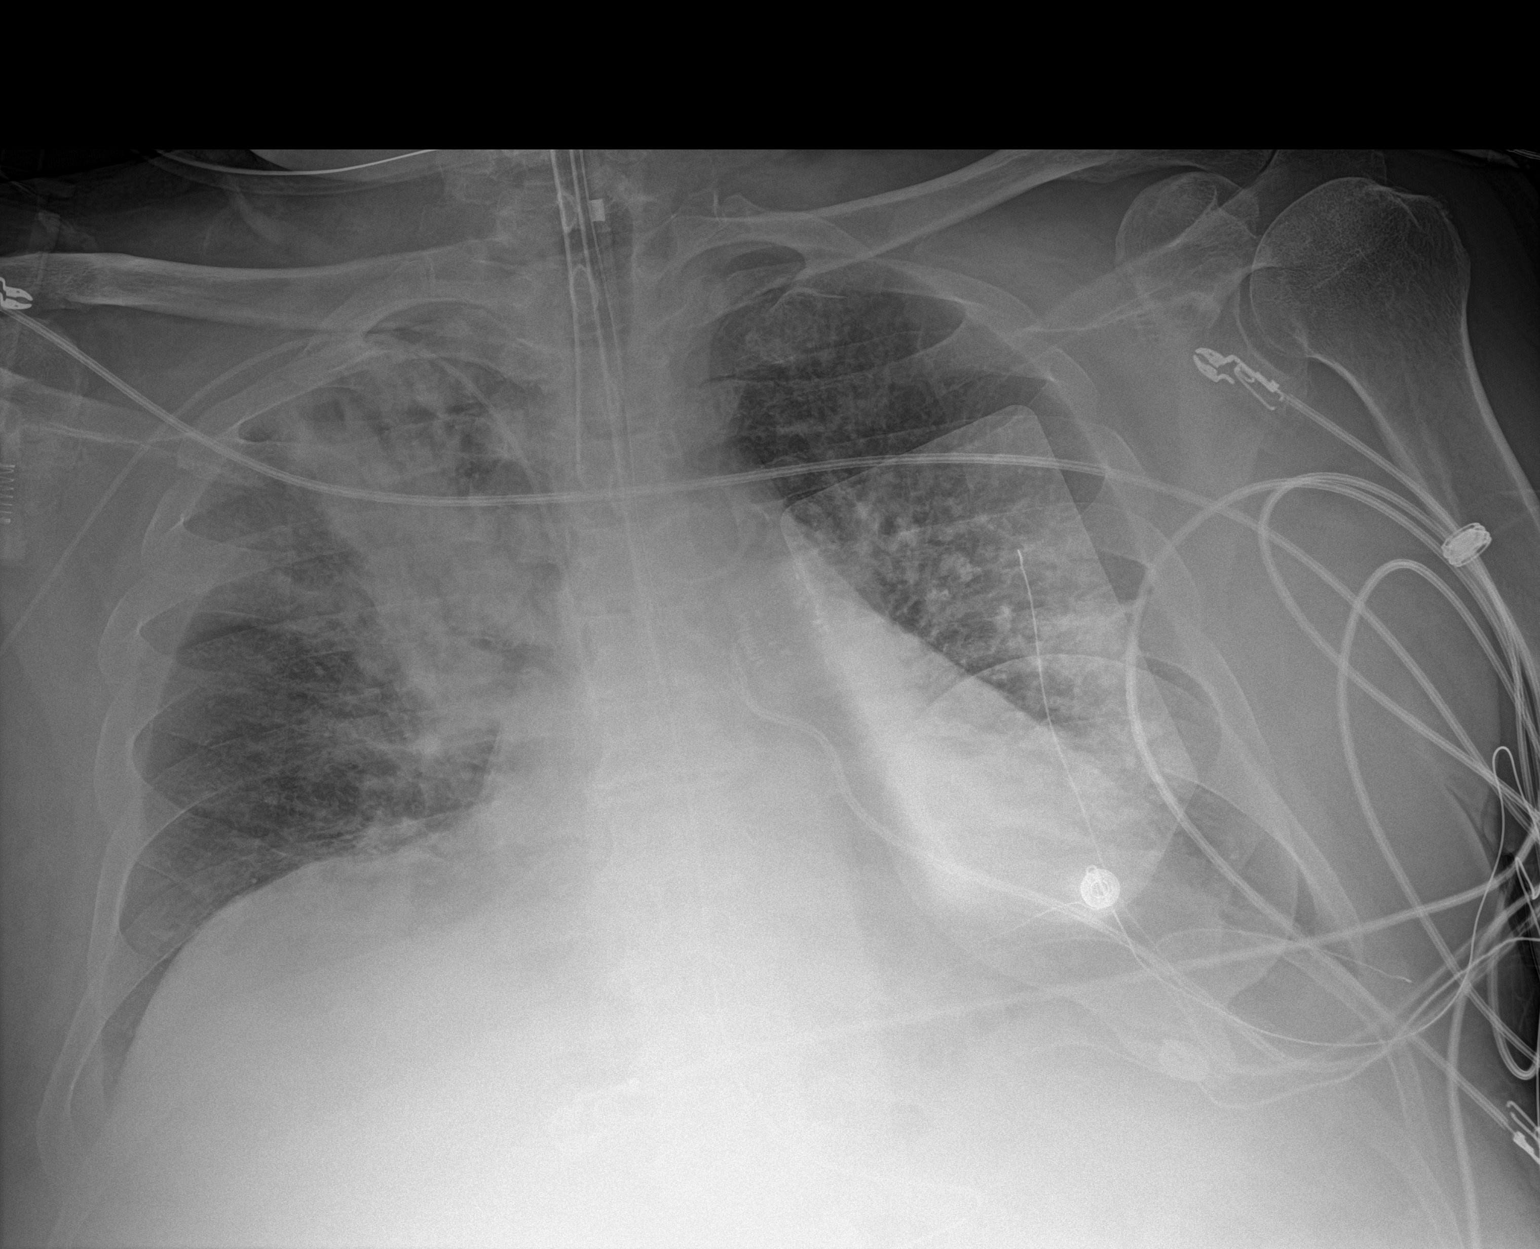

[1 of 1 positions shown; findings below may reference images not displayed]

FINDINGS: The new right PICC line terminates in the central SVC. The ETT is in
good position. The NG tube terminates below today's film. Worsening
right suprahilar infiltrate.
IMPRESSION: 1. Support apparatus as above. The new right PICC line is in good
position.
2. Worsening right suprahilar infiltrate.

## 2020-04-30 IMAGING — CT CT HEAD WITHOUT CONTRAST
4 series · 16 of 47 positions shown, 18 images · non-contrast
Comparison: 10/19/2017

CLINICAL DATA: Post arrest, anoxia

EXAM:
CT HEAD WITHOUT CONTRAST
TECHNIQUE: Contiguous axial images were obtained from the base of the skull
through the vertex without intravenous contrast.

[Series 3: head without · axial · non-contrast · 0.45mm/px · z∈[-208,-68]mm · 7 of 38 slices shown, 9 images]
[im 5/38  brain]
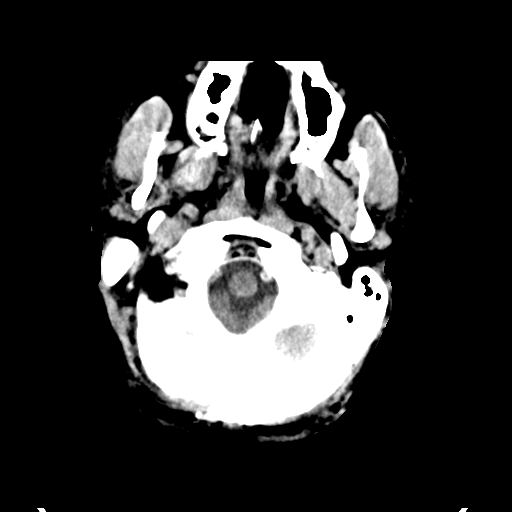
[im 5/38  bone]
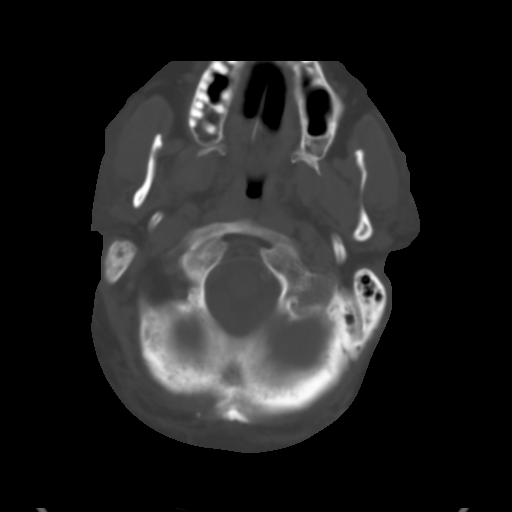
[im 10/38  brain]
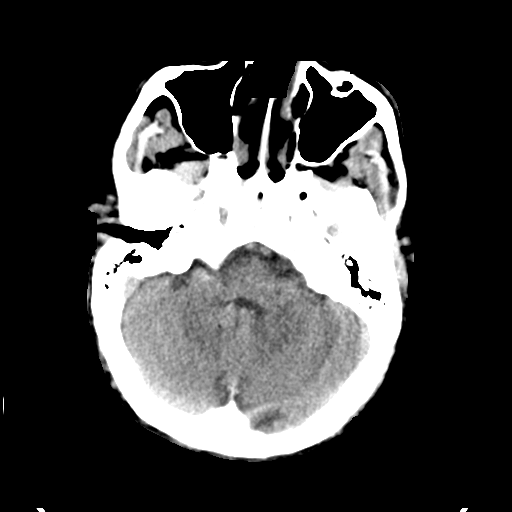
[im 14/38  brain]
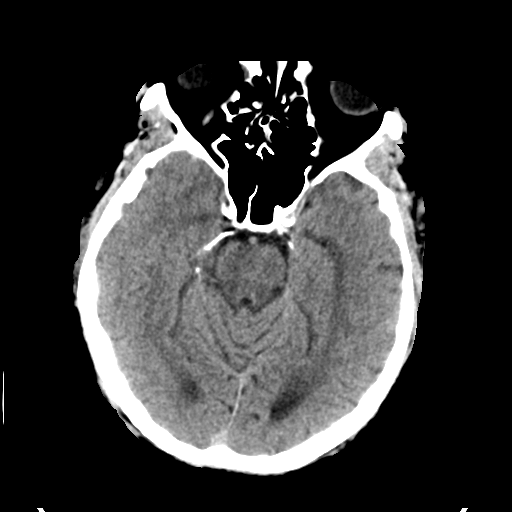
[im 19/38  brain]
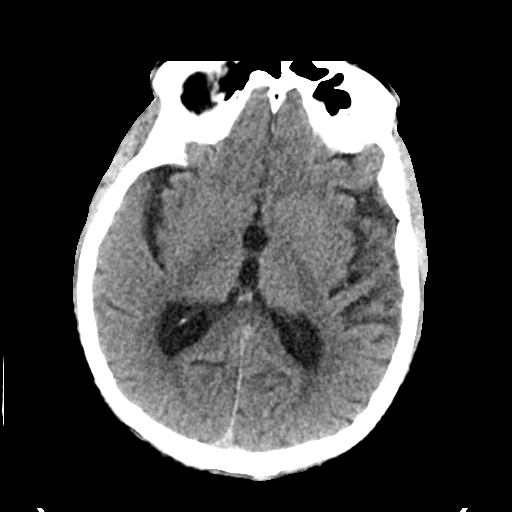
[im 24/38  brain]
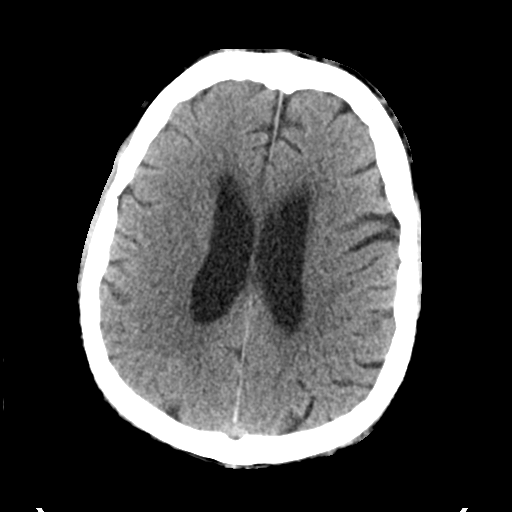
[im 24/38  bone]
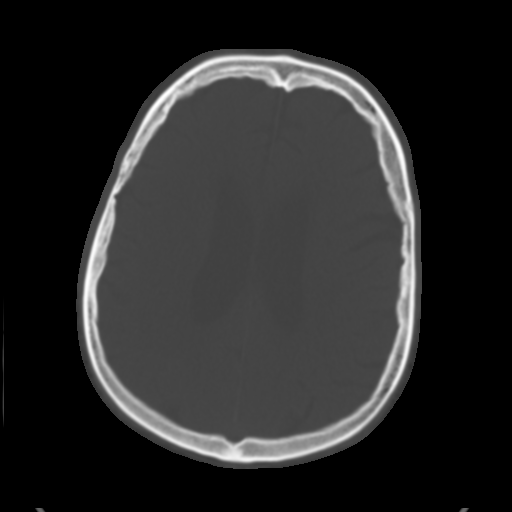
[im 28/38  brain]
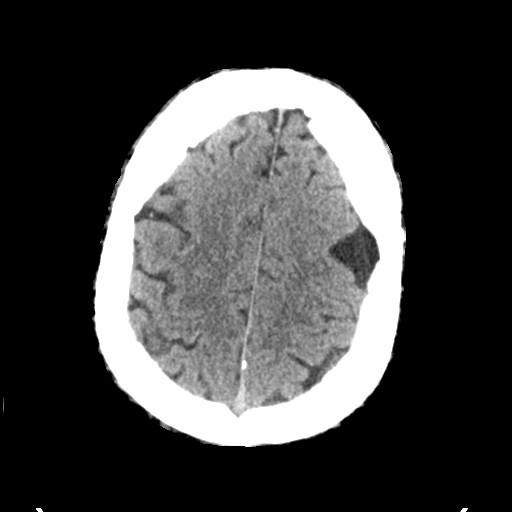
[im 33/38  brain]
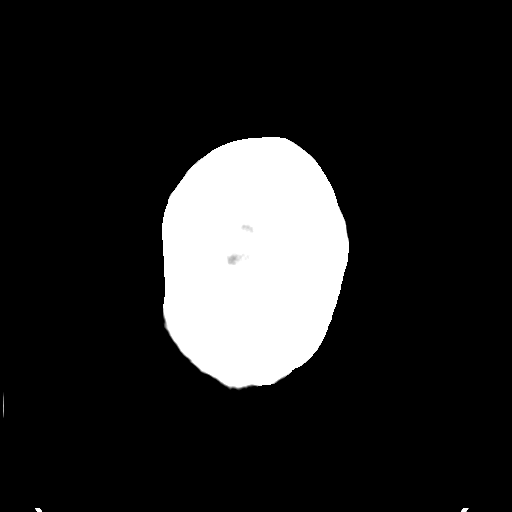

[Series 4: head bone · axial · 0.45mm/px · z∈[-210,-174]mm · 3 of 93 slices shown]
[im 10/93  bone]
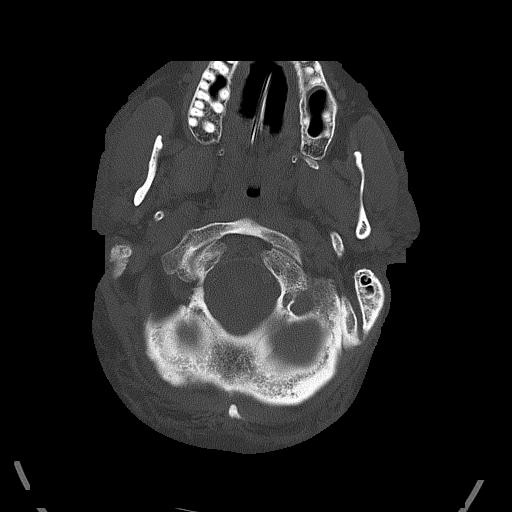
[im 19/93  bone]
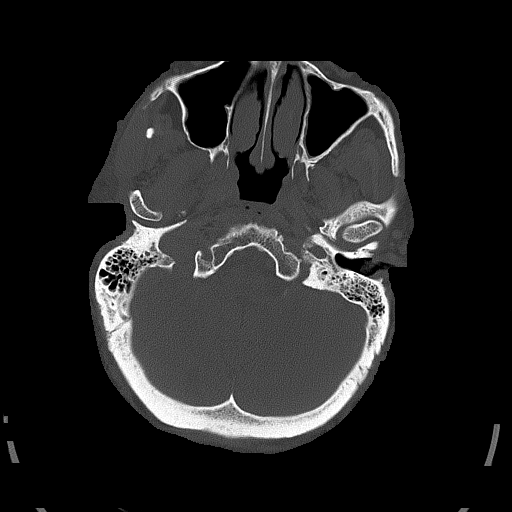
[im 28/93  bone]
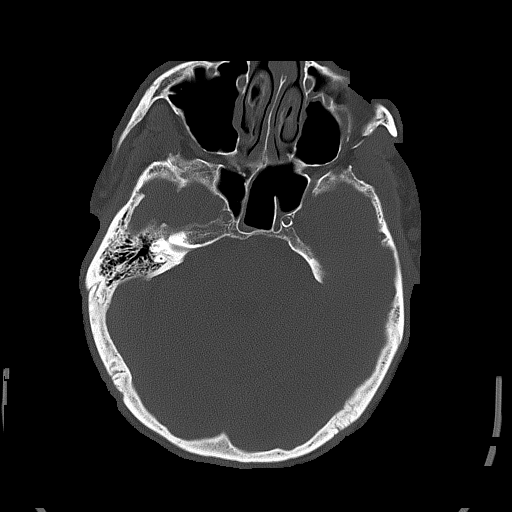

[Series 5: head without cor · coronal · non-contrast · 0.36mm/px · 3 of 74 slices shown]
[im 25/74  brain]
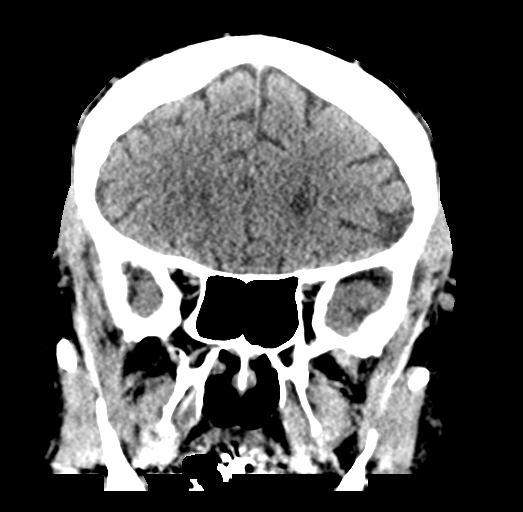
[im 33/74  brain]
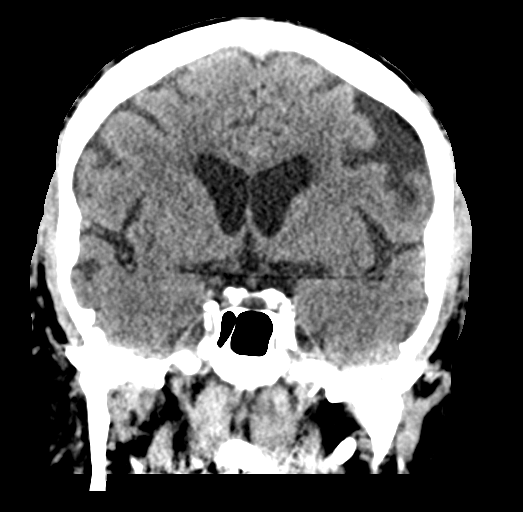
[im 41/74  brain]
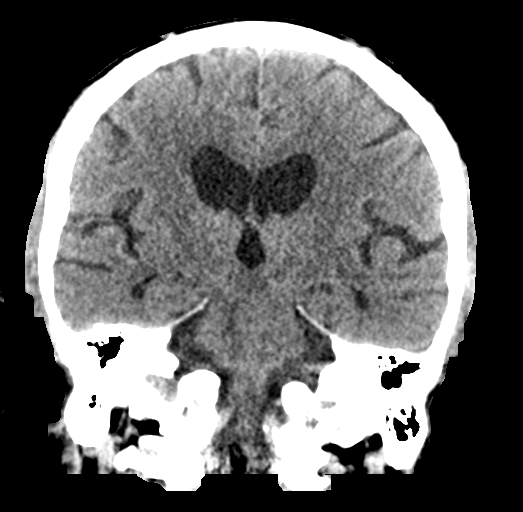

[Series 6: head without sag · sagittal · non-contrast · 0.33mm/px · 3 of 63 slices shown]
[im 21/63  brain]
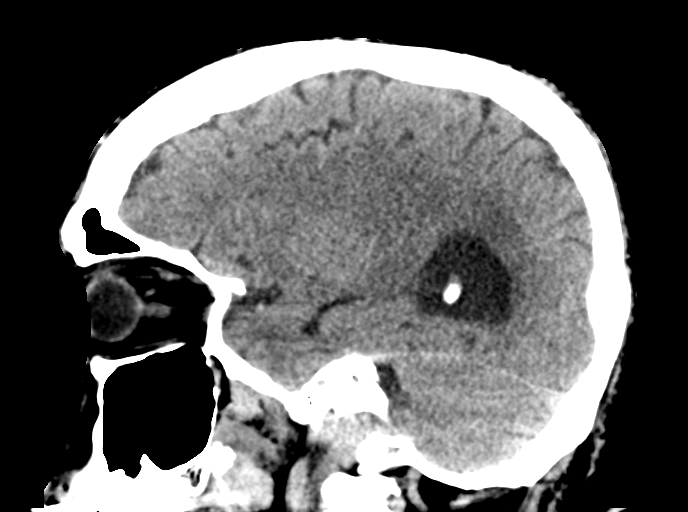
[im 32/63  brain]
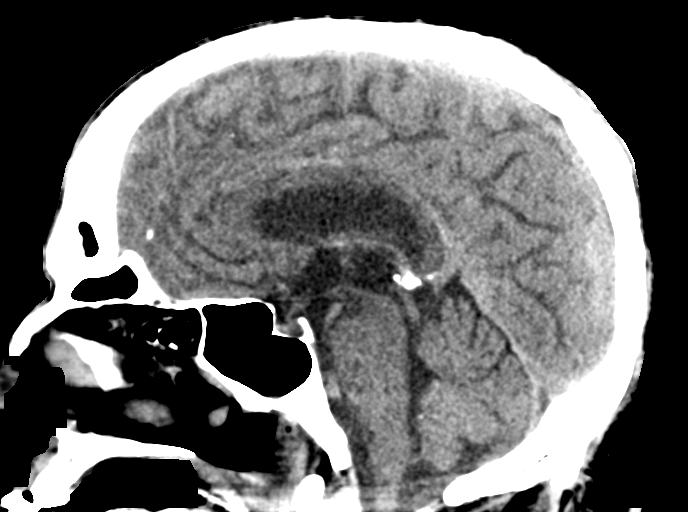
[im 42/63  brain]
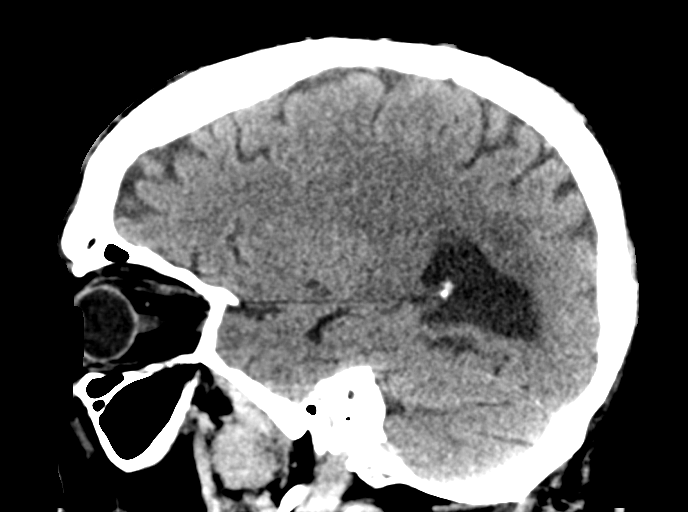

[16 of 47 positions shown; findings below may reference images not displayed]

FINDINGS: Brain: No evidence of acute infarction, hemorrhage, hydrocephalus,
extra-axial collection or mass lesion/mass effect. Unchanged mild
periventricular white matter hypodensity. Unchanged small probable
subarachnoid cyst about the left hemisphere.

Vascular: No hyperdense vessel or unexpected calcification.

Skull: Normal. Negative for fracture or focal lesion.

Sinuses/Orbits: No acute finding.

Other: Partially imaged endotracheal and orogastric tubes.
IMPRESSION: No acute intracranial pathology. Unchanged mild small-vessel white
matter disease. MRI is the test of choice for evaluation of ischemic
diffusion restriction in the setting of suspected anoxic brain
injury.
